# Patient Record
Sex: Male | Born: 1935
Health system: Southern US, Community
[De-identification: ages and names within clinical notes are randomized; demographics above are authoritative.]

## PROBLEM LIST (undated history)

## (undated) DIAGNOSIS — E079 Disorder of thyroid, unspecified: Secondary | ICD-10-CM

## (undated) DIAGNOSIS — N400 Enlarged prostate without lower urinary tract symptoms: Secondary | ICD-10-CM

## (undated) DIAGNOSIS — K5790 Diverticulosis of intestine, part unspecified, without perforation or abscess without bleeding: Secondary | ICD-10-CM

## (undated) DIAGNOSIS — I10 Essential (primary) hypertension: Secondary | ICD-10-CM

## (undated) DIAGNOSIS — K635 Polyp of colon: Secondary | ICD-10-CM

## (undated) DIAGNOSIS — C61 Malignant neoplasm of prostate: Secondary | ICD-10-CM

## (undated) DIAGNOSIS — I2510 Atherosclerotic heart disease of native coronary artery without angina pectoris: Secondary | ICD-10-CM

## (undated) DIAGNOSIS — E785 Hyperlipidemia, unspecified: Secondary | ICD-10-CM

## (undated) DIAGNOSIS — R972 Elevated prostate specific antigen [PSA]: Secondary | ICD-10-CM

## (undated) HISTORY — DX: Hyperlipidemia, unspecified: E78.5

## (undated) HISTORY — DX: Atherosclerotic heart disease of native coronary artery without angina pectoris: I25.10

## (undated) HISTORY — DX: Benign prostatic hyperplasia without lower urinary tract symptoms: N40.0

## (undated) HISTORY — DX: Diverticulosis of intestine, part unspecified, without perforation or abscess without bleeding: K57.90

## (undated) HISTORY — PX: PACEMAKER INSERTION: SHX728

## (undated) HISTORY — PX: HERNIA REPAIR: SHX51

## (undated) HISTORY — DX: Essential (primary) hypertension: I10

## (undated) HISTORY — DX: Polyp of colon: K63.5

## (undated) HISTORY — PX: PROSTATECTOMY: SHX69

## (undated) HISTORY — DX: Malignant neoplasm of prostate: C61

## (undated) HISTORY — DX: Elevated prostate specific antigen (PSA): R97.20

## (undated) HISTORY — DX: Disorder of thyroid, unspecified: E07.9

---

## 2012-11-12 ENCOUNTER — Other Ambulatory Visit: Payer: Self-pay | Admitting: *Deleted

## 2012-11-12 ENCOUNTER — Encounter: Payer: Self-pay | Admitting: Family Medicine

## 2012-11-12 MED ORDER — CLOBETASOL PROPIONATE 0.05 % EX CREA: TOPICAL_CREAM | Freq: Two times a day (BID) | CUTANEOUS | Status: DC

## 2012-11-12 NOTE — Telephone Encounter (Signed)
Need pharmacy if wants e-RX

## 2012-11-12 NOTE — Telephone Encounter (Signed)
Last seen 07/13/12, not on med list

## 2012-11-12 NOTE — Telephone Encounter (Signed)
[facility] said [facility] called this man and this was not his rx

## 2012-12-27 NOTE — Telephone Encounter (Signed)
ERROR

## 2013-01-03 ENCOUNTER — Encounter: Payer: Self-pay | Admitting: Family Medicine

## 2013-01-03 ENCOUNTER — Other Ambulatory Visit: Payer: Self-pay | Admitting: Family Medicine

## 2013-01-03 ENCOUNTER — Ambulatory Visit (INDEPENDENT_AMBULATORY_CARE_PROVIDER_SITE_OTHER): Payer: Medicare Other | Admitting: Family Medicine

## 2013-01-03 VITALS — BP 168/110 | HR 86 | Temp 98.0°F | Ht 71.5 in | Wt 174.2 lb

## 2013-01-03 DIAGNOSIS — I10 Essential (primary) hypertension: Secondary | ICD-10-CM

## 2013-01-03 DIAGNOSIS — M109 Gout, unspecified: Secondary | ICD-10-CM

## 2013-01-03 DIAGNOSIS — I25810 Atherosclerosis of coronary artery bypass graft(s) without angina pectoris: Secondary | ICD-10-CM

## 2013-01-03 DIAGNOSIS — Z8546 Personal history of malignant neoplasm of prostate: Secondary | ICD-10-CM

## 2013-01-03 DIAGNOSIS — E785 Hyperlipidemia, unspecified: Secondary | ICD-10-CM

## 2013-01-03 DIAGNOSIS — N189 Chronic kidney disease, unspecified: Secondary | ICD-10-CM

## 2013-01-03 LAB — COMPLETE METABOLIC PANEL WITH GFR
Albumin: 3.8 g/dL (ref 3.5–5.2)
Alkaline Phosphatase: 60 U/L (ref 39–117)
BUN: 24 mg/dL — ABNORMAL HIGH (ref 6–23)
Creat: 2.02 mg/dL — ABNORMAL HIGH (ref 0.50–1.35)
GFR, Est Non African American: 30 mL/min — ABNORMAL LOW
Glucose, Bld: 93 mg/dL (ref 70–99)
Potassium: 5 mEq/L (ref 3.5–5.3)

## 2013-01-03 NOTE — Patient Instructions (Addendum)
Fall precautions discussed. Continue therapeutic lifestyle changes and continue current medications.

## 2013-01-03 NOTE — Progress Notes (Signed)
  Subjective:    Patient ID: Ivan West, male    DOB: 1931-07-26, 79 y.o.   MRN: [id number]  HPI Patient returns today for followup of chronic problems.. He sees Dr.Gibbs, the gastroenterologist. He also sees Dr. Wyvonnia Lora the nephrologist t. He will see her in June 18. He is followed by Dr. Esperanza Sheets for his   Gout. He also sees Dr. Zenia Resides for his cardiology problems.   Review of Systems  Constitutional: Negative.   HENT: Negative.   Eyes: Negative.   Respiratory: Negative.   Cardiovascular: Negative.   Gastrointestinal: Negative.   Endocrine: Negative.   Genitourinary: Negative.   Musculoskeletal: Negative.   Skin: Negative.   Allergic/Immunologic: Negative.   Neurological: Negative.   Hematological: Negative.   Psychiatric/Behavioral: Negative.        Objective:   Physical Exam BP 168/110  Pulse 86  Temp(Src) 98 F (36.7 C) (Oral)  Ht 5' 11.5" (1.816 m)  Wt 174 lb 3.2 oz (79.017 kg)  BMI 23.96 kg/m2 Blood pressure repeat 152/90 The patient appeared well nourished and normally developed, alert and oriented to time and place. The patient appears much younger than his stated age. Speech, behavior and judgement appear normal. Vital signs as documented.  Head exam is unremarkable. No scleral icterus or pallor noted. Ears and throat were normal. Neck is without jugular venous distension, thyromegally, or carotid bruits. Carotid upstrokes are brisk bilaterally. No cervical adenopathy. Lungs are clear anteriorly and posteriorly to auscultation. Normal respiratory effort. Cardiac exam reveals regular rate and rhythm at 60 per minute. First and second heart sounds normal.  No murmurs, rubs or gallops.  Abdominal exam reveals normal bowl sounds, no masses, no organomegaly and no aortic enlargement. No inguinal adenopathy. No abdominal tenderness Extremities are nonedematous and both femoral and pedal pulses are normal. Skin without pallor or jaundice.  Warm and dry, without  rash. Neurologic exam reveals normal deep tendon reflexes and normal sensation.          Assessment & Plan:  1. HTN (hypertension) - COMPLETE METABOLIC PANEL WITH GFR - NMR Lipoprofile with Lipids  2. CAD (coronary artery disease) of artery bypass graft - COMPLETE METABOLIC PANEL WITH GFR - NMR Lipoprofile with Lipids  3. Other and unspecified hyperlipidemia - COMPLETE METABOLIC PANEL WITH GFR - NMR Lipoprofile with Lipids  4. CKD   Patient Instructions  Fall precautions discussed. Continue therapeutic lifestyle changes and continue current medications.   Drop by next week and get blood pressure checked by nurse and bring home readings at that time

## 2013-01-08 LAB — NMR LIPOPROFILE WITH LIPIDS
HDL Size: 9 nm — ABNORMAL LOW (ref >=9.2)
LDL (calc): 83 mg/dL (ref <100)
LDL Particle Number: 780 nmol/L (ref <1000)
Large VLDL-P: <0.8 nmol/L (ref <=2.7)
Small LDL Particle Number: 217 nmol/L (ref <=527)
Triglycerides: 124 mg/dL (ref <150)
VLDL Size: 40.8 nm (ref <=46.6)

## 2013-01-09 ENCOUNTER — Telehealth: Payer: Self-pay | Admitting: Pharmacist

## 2013-01-09 NOTE — Telephone Encounter (Signed)
appt made and patient notified.

## 2013-01-09 NOTE — Telephone Encounter (Signed)
Message copied by Cherre Robins on Wed Jan 09, 2013  5:36 PM ------      Message from: Chipper Herb      Created: Tue Jan 08, 2013  5:18 PM       Electrolytes are good. Creatinine is elevated at 2.02. Previous creatinine was 1.66.. this should be repeated non-fasting to confirm the additional elevation.+++   Avoid NSAIDS. Make an appointment with Lareen Mullings or. Sharyn Lull. The Lotensin which is an ACE inhibitor may have to be discontinued or reduced      Liver function tests were within normal limits      Calcium slightly elevated at 10.6      Advanced lipid testing had a total LDL particle number of 780. This is good. Triglycerides were slightly increased. Small LDL particle number was low.      Continue aggressive therapeutic lifestyle changes and blood sugar control.            It is very important he gets the appointment with Floyd Wade or Sharyn Lull as soon as possible and gets his BM P. repeated ------

## 2013-01-11 ENCOUNTER — Ambulatory Visit (INDEPENDENT_AMBULATORY_CARE_PROVIDER_SITE_OTHER): Payer: Medicare Other | Admitting: *Deleted

## 2013-01-11 DIAGNOSIS — R7989 Other specified abnormal findings of blood chemistry: Secondary | ICD-10-CM

## 2013-01-11 NOTE — Progress Notes (Signed)
  Subjective:    Patient ID: Ivan West, male    DOB: 1931-07-07, 79 y.o.   MRN: [id number]  HPI    Review of Systems     Objective:   Physical Exam        Assessment & Plan:

## 2013-01-12 LAB — BASIC METABOLIC PANEL WITH GFR
BUN: 25 mg/dL — ABNORMAL HIGH (ref 6–23)
Creat: 2.11 mg/dL — ABNORMAL HIGH (ref 0.50–1.35)
GFR, Est African American: 33 mL/min — ABNORMAL LOW
GFR, Est Non African American: 29 mL/min — ABNORMAL LOW
Potassium: 4.9 mEq/L (ref 3.5–5.3)

## 2013-01-15 ENCOUNTER — Telehealth: Payer: Self-pay | Admitting: *Deleted

## 2013-01-15 NOTE — Telephone Encounter (Signed)
Message copied by Marin Olp on Tue Jan 15, 2013  6:03 PM ------      Message from: Chipper Herb      Created: Tue Jan 08, 2013  5:18 PM       Electrolytes are good. Creatinine is elevated at 2.02. Previous creatinine was 1.66.. this should be repeated non-fasting to confirm the additional elevation.+++   Avoid NSAIDS. Make an appointment with Tammy or. Sharyn Lull. The Lotensin which is an ACE inhibitor may have to be discontinued or reduced      Liver function tests were within normal limits      Calcium slightly elevated at 10.6      Advanced lipid testing had a total LDL particle number of 780. This is good. Triglycerides were slightly increased. Small LDL particle number was low.      Continue aggressive therapeutic lifestyle changes and blood sugar control.            It is very important he gets the appointment with Tammy or Sharyn Lull as soon as possible and gets his BM P. repeated ------

## 2013-01-15 NOTE — Telephone Encounter (Signed)
Pt notified of lab results Already has appt with [facility] on 6/17

## 2013-01-29 ENCOUNTER — Ambulatory Visit (INDEPENDENT_AMBULATORY_CARE_PROVIDER_SITE_OTHER): Payer: Medicare Other | Admitting: Pharmacist

## 2013-01-29 VITALS — BP 160/90 | HR 74 | Ht 71.5 in | Wt 175.0 lb

## 2013-01-29 DIAGNOSIS — R7989 Other specified abnormal findings of blood chemistry: Secondary | ICD-10-CM | POA: Insufficient documentation

## 2013-01-29 DIAGNOSIS — I10 Essential (primary) hypertension: Secondary | ICD-10-CM

## 2013-01-29 DIAGNOSIS — R799 Abnormal finding of blood chemistry, unspecified: Secondary | ICD-10-CM

## 2013-01-29 DIAGNOSIS — M109 Gout, unspecified: Secondary | ICD-10-CM

## 2013-01-29 DIAGNOSIS — I129 Hypertensive chronic kidney disease with stage 1 through stage 4 chronic kidney disease, or unspecified chronic kidney disease: Secondary | ICD-10-CM | POA: Insufficient documentation

## 2013-01-29 MED ORDER — AMLODIPINE BESYLATE 5 MG PO TABS: 5.0000 mg | ORAL_TABLET | Freq: Every day | ORAL | Status: DC

## 2013-01-29 NOTE — Progress Notes (Signed)
Chief Complaint  Patient presents with  . Medication Problem    elevated serum creatinine  . Hypertension     There were no vitals filed for this visit.   HPI: patient was referred by Dr Laurance Flatten to review medications in light of increase in serum creatinine.  Mr.. Ivan West states that he has an appoitment with a nephrologist tomorrow.  Patient also currently has uncontrolled hypertension.  Last Scr = 2.11 Estimated CrCl = 26.7 mL/min  Current Outpatient Prescriptions on File Prior to Visit  Medication Sig Dispense Refill  . aspirin 81 MG tablet Take 81 mg by mouth daily.      . benazepril (LOTENSIN) 40 MG tablet Take 40 mg by mouth daily.      . carvedilol (COREG) 12.5 MG tablet Take 12.5 mg by mouth 2 (two) times daily with a meal.      . cholecalciferol (VITAMIN D) 1000 UNITS tablet Take 1,000 Units by mouth daily.      . febuxostat (ULORIC) 40 MG tablet Take 80 mg by mouth daily.      . fish oil-omega-3 fatty acids 1000 MG capsule Take 1 g by mouth daily.      . furosemide (LASIX) 20 MG tablet TAKE 1 TABLET AS DIRECTED AS NEEDED.  30 tablet  5  . levothyroxine (SYNTHROID, LEVOTHROID) 75 MCG tablet Take 75 mcg by mouth daily before breakfast.      . mirabegron ER (MYRBETRIQ) 50 MG TB24 Take 50 mg by mouth daily.      . potassium & sodium citrate-citric acid-sucrose (POLYCITRA) 550-500-334 MG/5ML SYRP Take 5 mLs by mouth 2 (two) times daily.       No current facility-administered medications on file prior to visit.    Assessment:      Increased serum creatinine      Hypertension      Gout   Plan: 1.  Hold benazepril.  Start amlodipine 5mg  1 tablet daily 2.  Also gave patient list of medications to discuss with nephrologist tomorrow to consider holding, decreasing dose or discontinuing.  This list included:  Hold vitamin D, hold uloric (has not been studied in subjects with CrCl less than 54mL.min), decrease mirabegron ER to 25mg  daily (dose adjustment recommended by manufacturer  for CrCl 15 to 29) 3.  RTC in 2-3 weeks to recheck BP and BMP

## 2013-01-30 ENCOUNTER — Other Ambulatory Visit: Payer: Self-pay | Admitting: Family Medicine

## 2013-01-30 ENCOUNTER — Telehealth: Payer: Self-pay | Admitting: Pharmacist

## 2013-01-30 NOTE — Telephone Encounter (Signed)
Ok noted  

## 2013-01-30 NOTE — Progress Notes (Signed)
Patient called after appt with nephrologist today.  Nephrologist did agree with discontinuation of benazepril and start amlodipine.  Continue all other medications for now.

## 2013-02-01 NOTE — Telephone Encounter (Signed)
See last Thyroid labs, chart sent back

## 2013-03-07 ENCOUNTER — Other Ambulatory Visit: Payer: Self-pay | Admitting: Family Medicine

## 2013-05-20 ENCOUNTER — Ambulatory Visit (INDEPENDENT_AMBULATORY_CARE_PROVIDER_SITE_OTHER): Payer: Medicare Other

## 2013-05-20 DIAGNOSIS — Z23 Encounter for immunization: Secondary | ICD-10-CM

## 2013-06-05 ENCOUNTER — Other Ambulatory Visit: Payer: Self-pay | Admitting: Family Medicine

## 2013-07-08 ENCOUNTER — Encounter (INDEPENDENT_AMBULATORY_CARE_PROVIDER_SITE_OTHER): Payer: Self-pay

## 2013-07-08 ENCOUNTER — Ambulatory Visit (INDEPENDENT_AMBULATORY_CARE_PROVIDER_SITE_OTHER): Payer: Medicare Other | Admitting: Family Medicine

## 2013-07-08 ENCOUNTER — Ambulatory Visit (INDEPENDENT_AMBULATORY_CARE_PROVIDER_SITE_OTHER): Payer: Medicare Other

## 2013-07-08 ENCOUNTER — Encounter: Payer: Self-pay | Admitting: Family Medicine

## 2013-07-08 VITALS — BP 136/85 | HR 69 | Temp 97.6°F | Ht 71.5 in | Wt 172.0 lb

## 2013-07-08 DIAGNOSIS — E559 Vitamin D deficiency, unspecified: Secondary | ICD-10-CM

## 2013-07-08 DIAGNOSIS — I2510 Atherosclerotic heart disease of native coronary artery without angina pectoris: Secondary | ICD-10-CM | POA: Insufficient documentation

## 2013-07-08 DIAGNOSIS — R972 Elevated prostate specific antigen [PSA]: Secondary | ICD-10-CM | POA: Insufficient documentation

## 2013-07-08 DIAGNOSIS — E039 Hypothyroidism, unspecified: Secondary | ICD-10-CM | POA: Insufficient documentation

## 2013-07-08 DIAGNOSIS — R42 Dizziness and giddiness: Secondary | ICD-10-CM

## 2013-07-08 DIAGNOSIS — E785 Hyperlipidemia, unspecified: Secondary | ICD-10-CM | POA: Insufficient documentation

## 2013-07-08 DIAGNOSIS — C61 Malignant neoplasm of prostate: Secondary | ICD-10-CM | POA: Insufficient documentation

## 2013-07-08 DIAGNOSIS — I10 Essential (primary) hypertension: Secondary | ICD-10-CM

## 2013-07-08 DIAGNOSIS — Z23 Encounter for immunization: Secondary | ICD-10-CM

## 2013-07-08 LAB — POCT CBC
HCT, POC: 43.8 % (ref 43.5–53.7)
Hemoglobin: 13.6 g/dL — AB (ref 14.1–18.1)
Lymph, poc: 1.4 (ref 0.6–3.4)
MCH, POC: 25.2 pg — AB (ref 27–31.2)
MCV: 81.2 fL (ref 80–97)
MPV: 7.7 fL (ref 0–99.8)
POC LYMPH PERCENT: 37.7 %L (ref 10–50)
RBC: 5.4 M/uL (ref 4.69–6.13)
RDW, POC: 14.9 %

## 2013-07-08 NOTE — Patient Instructions (Addendum)
Continue current medications. Continue good therapeutic lifestyle changes which include good diet and exercise. Fall precautions discussed with patient. Schedule your flu vaccine if you haven't had it yet If you are over 77 years old - you may need Prevnar 33 or the adult Pneumonia vaccine. Monitor blood pressures closely at home and especially if he is feeling dizzy, check a blood pressure then Bring these readings for review in 2-3 weeks Get a good pair of support hose and put these beside your bed and put these on every morning upon arising  There is a cool mist humidifier and drink plenty of fluids and keep the house is cool as possible. You will receive a Prevnar shot today. Return here FOBT when it is completed. We will call you with the results of the lab work and a chest x-ray interpretation once they are available.

## 2013-07-08 NOTE — Progress Notes (Signed)
Subjective:    Patient ID: Ivan West, male    DOB: [birth year], 79 y.o.   MRN: [id number]  HPI Pt here for follow up and management of chronic medical problems. This patient is always present and is followed by several specialists for his multiple problems. He sees a rheumatologist, a nephrologist, a urologist, a cardiologist, and a gastroenterologist about every 5 years. He has kept up to date with all of his visits.      Patient Active Problem List   Diagnosis Date Noted  . Hypertension 01/29/2013  . Elevated serum creatinine 01/29/2013  . Gout 01/29/2013   Outpatient Encounter Prescriptions as of 07/08/2013  Medication Sig  . amLODipine (NORVASC) 10 MG tablet TAKE (1) TABLET DAILY  . aspirin 81 MG tablet Take 81 mg by mouth daily.  . carvedilol (COREG) 12.5 MG tablet Take 12.5 mg by mouth 2 (two) times daily with a meal.  . cholecalciferol (VITAMIN D) 1000 UNITS tablet Take 1,000 Units by mouth daily.  . febuxostat (ULORIC) 40 MG tablet Take 80 mg by mouth daily.  . fish oil-omega-3 fatty acids 1000 MG capsule Take 1 g by mouth daily.  . furosemide (LASIX) 20 MG tablet TAKE 1 TABLET AS DIRECTED AS NEEDED.  Marland Kitchen levothyroxine (SYNTHROID, LEVOTHROID) 75 MCG tablet TAKE (1) TABLET DAILY  . [DISCONTINUED] amLODipine (NORVASC) 5 MG tablet Take 1 tablet (5 mg total) by mouth daily.  . [DISCONTINUED] benazepril (LOTENSIN) 40 MG tablet Take 40 mg by mouth daily.  . [DISCONTINUED] levothyroxine (SYNTHROID, LEVOTHROID) 75 MCG tablet TAKE (1) TABLET DAILY  . [DISCONTINUED] mirabegron ER (MYRBETRIQ) 50 MG TB24 Take 50 mg by mouth daily.  . [DISCONTINUED] potassium & sodium citrate-citric acid-sucrose (POLYCITRA) 550-500-334 MG/5ML SYRP Take 5 mLs by mouth 2 (two) times daily.    Review of Systems  Constitutional: Negative.   HENT: Negative.   Eyes: Negative.   Respiratory: Negative.   Cardiovascular: Negative.   Gastrointestinal: Negative.   Endocrine: Negative.   Genitourinary:  Negative.   Musculoskeletal: Negative.   Skin: Negative.   Allergic/Immunologic: Negative.   Neurological: Positive for dizziness (worse with quick movements).  Hematological: Negative.   Psychiatric/Behavioral: Negative.        Objective:   Physical Exam  Nursing note and vitals reviewed. Constitutional: He is oriented to person, place, and time. He appears well-developed and well-nourished.  Always smiling and very pleasant  HENT:  Head: Normocephalic and atraumatic.  Right Ear: External ear normal.  Left Ear: External ear normal.  Nose: Nose normal.  Mouth/Throat: Oropharynx is clear and moist. No oropharyngeal exudate.  Eyes: Conjunctivae and EOM are normal. Pupils are equal, round, and reactive to light. Right eye exhibits no discharge. Left eye exhibits no discharge. No scleral icterus.  Neck: Normal range of motion. Neck supple. No tracheal deviation present. No thyromegaly present.  No carotid bruit  Cardiovascular: Normal rate, regular rhythm, normal heart sounds and intact distal pulses.  Exam reveals no gallop and no friction rub.   No murmur heard. 72 per minute  Pulmonary/Chest: Effort normal and breath sounds normal. No respiratory distress. He has no wheezes. He has no rales. He exhibits no tenderness.  Abdominal: Soft. Bowel sounds are normal. He exhibits no mass. There is no tenderness. There is no rebound and no guarding.  Patient has a small umbilical hernia  Musculoskeletal: Normal range of motion. He exhibits no edema and no tenderness.  Lymphadenopathy:    He has no cervical adenopathy.  Neurological: He is  alert and oriented to person, place, and time. He has normal reflexes. No cranial nerve deficit.  Skin: Skin is warm and dry. No rash noted.  Psychiatric: He has a normal mood and affect. His behavior is normal. Judgment and thought content normal.   BP 136/85  Pulse 69  Temp(Src) 97.6 F (36.4 C) (Oral)  Ht 5' 11.5" (1.816 m)  Wt 172 lb (78.019 kg)   BMI 23.66 kg/m2        Assessment & Plan:   1. Hypertension   2. Vitamin D deficiency   3. Need for prophylactic vaccination against Streptococcus pneumoniae (pneumococcus)   4. Hypothyroidism   5. Hyperlipidemia   6. Elevated PSA   7. Prostate cancer   8. CAD (coronary artery disease)   9. Dizziness    Orders Placed This Encounter  Procedures  . DG Chest 2 View    Standing Status: Future     Number of Occurrences:      Standing Expiration Date: 09/07/2014    Order Specific Question:  Reason for Exam (SYMPTOM  OR DIAGNOSIS REQUIRED)    Answer:  htn    Order Specific Question:  Preferred imaging location?    Answer:  Internal  . Pneumococcal conjugate vaccine 13-valent  . BMP8+EGFR  . Hepatic function panel  . NMR, lipoprofile  . Vit D  25 hydroxy (rtn osteoporosis monitoring)  . POCT CBC   No orders of the defined types were placed in this encounter.   Patient Instructions  Continue current medications. Continue good therapeutic lifestyle changes which include good diet and exercise. Fall precautions discussed with patient. Schedule your flu vaccine if you haven't had it yet If you are over 9 years old - you may need Prevnar 30 or the adult Pneumonia vaccine. Monitor blood pressures closely at home and especially if he is feeling dizzy, check a blood pressure then Bring these readings for review in 2-3 weeks Get a good pair of support hose and put these beside your bed and put these on every morning upon arising  There is a cool mist humidifier and drink plenty of fluids and keep the house is cool as possible. You will receive a Prevnar shot today. Return here FOBT when it is completed. We will call you with the results of the lab work and a chest x-ray interpretation once they are available.   Arrie Senate MD

## 2013-07-09 LAB — NMR, LIPOPROFILE
Cholesterol: 147 mg/dL (ref <200)
LDL Particle Number: 986 nmol/L (ref <1000)
LDL Size: 21.1 nm (ref >20.5)
LDLC SERPL CALC-MCNC: 81 mg/dL (ref <100)
LP-IR Score: 41 (ref <=45)
Small LDL Particle Number: 511 nmol/L (ref <=527)
Triglycerides by NMR: 88 mg/dL (ref <150)

## 2013-07-09 LAB — HEPATIC FUNCTION PANEL
AST: 18 IU/L (ref 0–40)
Alkaline Phosphatase: 79 IU/L (ref 39–117)
Bilirubin, Direct: 0.09 mg/dL (ref 0.00–0.40)
Total Bilirubin: 0.4 mg/dL (ref 0.0–1.2)
Total Protein: 6.7 g/dL (ref 6.0–8.5)

## 2013-07-09 LAB — BMP8+EGFR
Calcium: 10.6 mg/dL — ABNORMAL HIGH (ref 8.6–10.2)
Creatinine, Ser: 1.82 mg/dL — ABNORMAL HIGH (ref 0.76–1.27)
GFR calc Af Amer: 40 mL/min/{1.73_m2} — ABNORMAL LOW (ref >59)
GFR calc non Af Amer: 34 mL/min/{1.73_m2} — ABNORMAL LOW (ref >59)
Glucose: 83 mg/dL (ref 65–99)
Potassium: 4.4 mmol/L (ref 3.5–5.2)
Sodium: 142 mmol/L (ref 134–144)

## 2013-07-16 ENCOUNTER — Other Ambulatory Visit (INDEPENDENT_AMBULATORY_CARE_PROVIDER_SITE_OTHER): Payer: Medicare Other

## 2013-07-16 DIAGNOSIS — Z1212 Encounter for screening for malignant neoplasm of rectum: Secondary | ICD-10-CM

## 2013-07-16 NOTE — Progress Notes (Signed)
Pt came in for FOBT

## 2013-07-17 ENCOUNTER — Telehealth: Payer: Self-pay | Admitting: *Deleted

## 2013-07-17 LAB — FECAL OCCULT BLOOD, IMMUNOCHEMICAL: Fecal Occult Bld: NEGATIVE

## 2013-07-17 NOTE — Telephone Encounter (Signed)
Lm to call back

## 2013-07-17 NOTE — Telephone Encounter (Signed)
Message copied by Priscille Heidelberg on Wed Jul 17, 2013 11:39 AM ------      Message from: Chipper Herb      Created: Tue Jul 09, 2013  9:37 PM       The blood sugar was normal. The creatinine was elevated at 1.8 to. Previously it was 2.11. Electrolytes were good. The calcium was slightly elevated at 10.6. 6 months ago it was also 10.6.      Liver function tests are within normal limit      On advanced lipid testing, the total LDL particle number is at goal and all cholesterol numbers are excellent      The vitamin D level is at the low of the normal range. ------

## 2013-07-18 NOTE — Telephone Encounter (Signed)
PT RETURNED NURSE CALL-  PT NOTIFIED OF NORMAL LAB RESULTS PER MESSAGE NURSE PUT IN NOTES.  R.SHAFFER 07/18/13

## 2013-08-05 ENCOUNTER — Telehealth: Payer: Self-pay | Admitting: *Deleted

## 2013-08-05 NOTE — Telephone Encounter (Signed)
Pt notified and verbalized understanding. April can you make sure all of pts lab results get sent to his cardiologist, nephrologist, and urologist. Thanks.

## 2013-08-05 NOTE — Telephone Encounter (Signed)
Message copied by Priscille Heidelberg on Mon Aug 05, 2013 12:58 PM ------      Message from: Chipper Herb      Created: Mon Jul 08, 2013  1:05 PM       The CBC has a white blood cell count that is slightly decreased. Hemoglobin is slightly decreased at 13.6, the platelet count is adequate.----Gleason a copy of this report and additional labs are coming to come in to his cardiologist, nephrologist, and urologist. ------

## 2013-08-06 NOTE — Telephone Encounter (Signed)
Message copied by Priscille Heidelberg on Tue Aug 06, 2013  2:01 PM ------      Message from: Chipper Herb      Created: Tue Jul 09, 2013  9:37 PM       The blood sugar was normal. The creatinine was elevated at 1.8 to. Previously it was 2.11. Electrolytes were good. The calcium was slightly elevated at 10.6. 6 months ago it was also 10.6.      Liver function tests are within normal limit      On advanced lipid testing, the total LDL particle number is at goal and all cholesterol numbers are excellent      The vitamin D level is at the low of the normal range. ------

## 2013-08-30 ENCOUNTER — Other Ambulatory Visit: Payer: Self-pay | Admitting: Family Medicine

## 2013-09-02 NOTE — Telephone Encounter (Signed)
Please have patient come in and get thyroid profile

## 2013-09-02 NOTE — Telephone Encounter (Signed)
No thyroid labwork done since 10-13. Please advise

## 2013-12-02 ENCOUNTER — Other Ambulatory Visit: Payer: Self-pay | Admitting: Family Medicine

## 2013-12-02 DIAGNOSIS — E039 Hypothyroidism, unspecified: Secondary | ICD-10-CM

## 2013-12-03 NOTE — Telephone Encounter (Signed)
Last seen 07/08/13  [facility] NO thryoid levels since EPIC

## 2013-12-04 NOTE — Telephone Encounter (Signed)
Left message that patient is due for a thyroid panel. This was not ordered at his visit in 06/2013. Future order placed for patient to come in for this bloodwork.

## 2013-12-05 ENCOUNTER — Other Ambulatory Visit (INDEPENDENT_AMBULATORY_CARE_PROVIDER_SITE_OTHER): Payer: Medicare Other

## 2013-12-05 DIAGNOSIS — E039 Hypothyroidism, unspecified: Secondary | ICD-10-CM

## 2013-12-05 NOTE — Progress Notes (Signed)
Patient came in for labs only.

## 2013-12-06 LAB — THYROID PANEL WITH TSH
FREE THYROXINE INDEX: 2.7 (ref 1.2–4.9)
T3 Uptake Ratio: 35 % (ref 24–39)
T4, Total: 7.6 ug/dL (ref 4.5–12.0)
TSH: 0.788 u[IU]/mL (ref 0.450–4.500)

## 2013-12-11 ENCOUNTER — Telehealth: Payer: Self-pay | Admitting: Family Medicine

## 2013-12-11 NOTE — Telephone Encounter (Signed)
Pt aware of lab results.  rs °

## 2013-12-11 NOTE — Telephone Encounter (Signed)
Message copied by Waverly Ferrari on Wed Dec 11, 2013 10:22 AM ------      Message from: Chipper Herb      Created: Fri Dec 06, 2013  8:39 AM       All thyroid function tests are within normal limits, continue current treatment ------

## 2014-01-07 ENCOUNTER — Ambulatory Visit (INDEPENDENT_AMBULATORY_CARE_PROVIDER_SITE_OTHER): Payer: Medicare Other | Admitting: Family Medicine

## 2014-01-07 ENCOUNTER — Encounter: Payer: Self-pay | Admitting: Family Medicine

## 2014-01-07 VITALS — BP 125/77 | HR 65 | Temp 96.9°F | Ht 71.5 in | Wt 172.0 lb

## 2014-01-07 DIAGNOSIS — R7989 Other specified abnormal findings of blood chemistry: Secondary | ICD-10-CM

## 2014-01-07 DIAGNOSIS — C61 Malignant neoplasm of prostate: Secondary | ICD-10-CM

## 2014-01-07 DIAGNOSIS — E039 Hypothyroidism, unspecified: Secondary | ICD-10-CM

## 2014-01-07 DIAGNOSIS — R5381 Other malaise: Secondary | ICD-10-CM

## 2014-01-07 DIAGNOSIS — R972 Elevated prostate specific antigen [PSA]: Secondary | ICD-10-CM

## 2014-01-07 DIAGNOSIS — I2510 Atherosclerotic heart disease of native coronary artery without angina pectoris: Secondary | ICD-10-CM

## 2014-01-07 DIAGNOSIS — R5383 Other fatigue: Secondary | ICD-10-CM

## 2014-01-07 DIAGNOSIS — E785 Hyperlipidemia, unspecified: Secondary | ICD-10-CM

## 2014-01-07 DIAGNOSIS — E559 Vitamin D deficiency, unspecified: Secondary | ICD-10-CM

## 2014-01-07 DIAGNOSIS — I10 Essential (primary) hypertension: Secondary | ICD-10-CM

## 2014-01-07 LAB — POCT GLYCOSYLATED HEMOGLOBIN (HGB A1C): Hemoglobin A1C: 5.8

## 2014-01-07 LAB — POCT CBC
Granulocyte percent: 62.1 %G (ref 37–80)
HCT, POC: 47.3 % (ref 43.5–53.7)
Hemoglobin: 14.7 g/dL (ref 14.1–18.1)
Lymph, poc: 1.1 (ref 0.6–3.4)
MCH, POC: 26.3 pg — AB (ref 27–31.2)
MCHC: 31.2 g/dL — AB (ref 31.8–35.4)
MCV: 84.4 fL (ref 80–97)
MPV: 8.5 fL (ref 0–99.8)
POC GRANULOCYTE: 2.2 (ref 2–6.9)
POC LYMPH %: 31.8 % (ref 10–50)
Platelet Count, POC: 181 10*3/uL (ref 142–424)
RBC: 5.6 M/uL (ref 4.69–6.13)
RDW, POC: 14.4 %
WBC: 3.6 10*3/uL — AB (ref 4.6–10.2)

## 2014-01-07 NOTE — Patient Instructions (Addendum)
Medicare Annual Wellness Visit  [facility] and the medical providers at [facility] strive to bring you the best medical care.  In doing so we not only want to address your current medical conditions and concerns but also to detect new conditions early and prevent illness, disease and health-related problems.    Medicare offers a yearly Wellness Visit which allows our clinical staff to assess your need for preventative services including immunizations, lifestyle education, counseling to decrease risk of preventable diseases and screening for fall risk and other medical concerns.    This visit is provided free of charge (no copay) for all Medicare recipients. The clinical pharmacists at [facility] have begun to conduct these Wellness Visits which will also include a thorough review of all your medications.    As you primary medical provider recommend that you make an appointment for your Annual Wellness Visit if you have not done so already this year.  You may set up this appointment before you leave today or you may call back [phone]) and schedule an appointment.  Please make sure when you call that you mention that you are scheduling your Annual Wellness Visit with the clinical pharmacist so that the appointment may be made for the proper length of time.      Continue current medications. Continue good therapeutic lifestyle changes which include good diet and exercise. Fall precautions discussed with patient. If an FOBT was given today- please return it to our front desk. If you are over 64 years old - you may need Prevnar 32 or the adult Pneumonia vaccine.  Continue to monitor blood pressures at home Continue followup with specialists Drink plenty of fluids

## 2014-01-07 NOTE — Addendum Note (Signed)
Addended by: Wyline Mood on: 01/07/2014 08:56 AM   Modules accepted: Orders

## 2014-01-07 NOTE — Progress Notes (Signed)
Subjective:    Patient ID: Ivan VIRGIN, male    DOB: 12/07/1930, 80 y.o.   MRN: [id number]  HPI Pt here for follow up and management of chronic medical problems. The patient is doing well. He is followed for his prostate by Dr. Ree Edman. He sees the rheumatologist Dr. Esperanza Sheets once a year. He is followed by the cardiologist about every 6 months and this is Dr. Zenia Resides. Dr. Hettie Holstein is his nephrologist and she sees him about every 6 months. The patient has a history of coronary artery disease, prostate cancer, renal insufficiency, hyperlipidemia and hypertension. He is up to date on his health maintenance issues. Please see the outside blood pressures that he brought in and all of these are good.        Patient Active Problem List   Diagnosis Date Noted  . Hypothyroidism 07/08/2013  . Hyperlipidemia 07/08/2013  . Elevated PSA 07/08/2013  . Prostate cancer 07/08/2013  . CAD (coronary artery disease) 07/08/2013  . Hypertension 01/29/2013  . Elevated serum creatinine 01/29/2013  . Gout 01/29/2013   Outpatient Encounter Prescriptions as of 01/07/2014  Medication Sig  . amLODipine (NORVASC) 10 MG tablet TAKE (1) TABLET DAILY  . aspirin 81 MG tablet Take 81 mg by mouth daily.  . carvedilol (COREG) 12.5 MG tablet Take 12.5 mg by mouth 2 (two) times daily with a meal.  . cholecalciferol (VITAMIN D) 1000 UNITS tablet Take 1,000 Units by mouth daily.  . febuxostat (ULORIC) 40 MG tablet Take 80 mg by mouth daily.  . fish oil-omega-3 fatty acids 1000 MG capsule Take 1 g by mouth daily.  . furosemide (LASIX) 20 MG tablet TAKE 1 TABLET AS DIRECTED AS NEEDED.  Marland Kitchen levothyroxine (SYNTHROID, LEVOTHROID) 75 MCG tablet TAKE 1 TABLET BY MOUTH DAILY    Review of Systems  Constitutional: Negative.   HENT: Negative.   Eyes: Negative.   Respiratory: Negative.   Cardiovascular: Negative.   Gastrointestinal: Negative.   Endocrine: Negative.   Genitourinary: Negative.   Musculoskeletal: Negative.     Skin: Negative.   Allergic/Immunologic: Negative.   Neurological: Negative.   Hematological: Negative.   Psychiatric/Behavioral: Negative.        Objective:   Physical Exam  Nursing note and vitals reviewed. Constitutional: He is oriented to person, place, and time. He appears well-developed and well-nourished. No distress.  Pleasant and cooperative and much younger appearing than his stated age  HENT:  Head: Normocephalic and atraumatic.  Right Ear: External ear normal.  Left Ear: External ear normal.  Nose: Nose normal.  Mouth/Throat: Oropharynx is clear and moist. No oropharyngeal exudate.  Eyes: Conjunctivae and EOM are normal. Pupils are equal, round, and reactive to light. Right eye exhibits no discharge. Left eye exhibits no discharge. No scleral icterus.  Neck: Normal range of motion. Neck supple. No thyromegaly present.  Cardiovascular: Normal rate, regular rhythm, normal heart sounds and intact distal pulses.  Exam reveals no gallop and no friction rub.   No murmur heard. At 72 per minute  Pulmonary/Chest: Effort normal and breath sounds normal. No respiratory distress. He has no wheezes. He has no rales. He exhibits no tenderness.  Abdominal: Soft. Bowel sounds are normal. He exhibits no mass. There is no tenderness. There is no rebound and no guarding.  Genitourinary:  This was followed by his urologist, Dr. Ree Edman  Musculoskeletal: Normal range of motion. He exhibits no edema and no tenderness.  Lymphadenopathy:    He has no cervical adenopathy.  Neurological: He  is alert and oriented to person, place, and time. He has normal reflexes. No cranial nerve deficit.  Skin: Skin is warm and dry. No rash noted. No erythema. No pallor.  Psychiatric: He has a normal mood and affect. His behavior is normal. Judgment and thought content normal.   BP 125/77  Pulse 65  Temp(Src) 96.9 F (36.1 C) (Oral)  Ht 5' 11.5" (1.816 m)  Wt 172 lb (78.019 kg)  BMI 23.66  kg/m2        Assessment & Plan:  1. CAD (coronary artery disease) - POCT CBC  2. Hyperlipidemia - POCT CBC - NMR, lipoprofile  3. Hypertension - POCT CBC - BMP8+EGFR - Hepatic function panel  4. Hypothyroidism - POCT CBC  5. Prostate cancer - POCT CBC  6. Elevated PSA -History of prostate cancer and prostatectomy - POCT CBC  7. Vitamin D deficiency - Vit D  25 hydroxy (rtn osteoporosis monitoring)  Patient Instructions                       Medicare Annual Wellness Visit  [facility] and the medical providers at [facility] strive to bring you the best medical care.  In doing so we not only want to address your current medical conditions and concerns but also to detect new conditions early and prevent illness, disease and health-related problems.    Medicare offers a yearly Wellness Visit which allows our clinical staff to assess your need for preventative services including immunizations, lifestyle education, counseling to decrease risk of preventable diseases and screening for fall risk and other medical concerns.    This visit is provided free of charge (no copay) for all Medicare recipients. The clinical pharmacists at [facility] have begun to conduct these Wellness Visits which will also include a thorough review of all your medications.    As you primary medical provider recommend that you make an appointment for your Annual Wellness Visit if you have not done so already this year.  You may set up this appointment before you leave today or you may call back [phone]) and schedule an appointment.  Please make sure when you call that you mention that you are scheduling your Annual Wellness Visit with the clinical pharmacist so that the appointment may be made for the proper length of time.      Continue current medications. Continue good therapeutic lifestyle changes which include good diet and exercise. Fall  precautions discussed with patient. If an FOBT was given today- please return it to our front desk. If you are over 62 years old - you may need Prevnar 14 or the adult Pneumonia vaccine.  Continue to monitor blood pressures at home Continue followup with specialists Drink plenty of fluids   Arrie Senate MD

## 2014-01-08 LAB — BMP8+EGFR
BUN/Creatinine Ratio: 14 (ref 10–22)
BUN: 33 mg/dL — ABNORMAL HIGH (ref 8–27)
CALCIUM: 10.6 mg/dL — AB (ref 8.6–10.2)
CO2: 17 mmol/L — ABNORMAL LOW (ref 18–29)
Chloride: 105 mmol/L (ref 97–108)
Creatinine, Ser: 2.36 mg/dL — ABNORMAL HIGH (ref 0.76–1.27)
GFR calc Af Amer: 29 mL/min/{1.73_m2} — ABNORMAL LOW (ref >59)
GFR calc non Af Amer: 25 mL/min/{1.73_m2} — ABNORMAL LOW (ref >59)
GLUCOSE: 91 mg/dL (ref 65–99)
POTASSIUM: 5.1 mmol/L (ref 3.5–5.2)
Sodium: 143 mmol/L (ref 134–144)

## 2014-01-08 LAB — NMR, LIPOPROFILE
CHOLESTEROL: 150 mg/dL (ref 100–199)
HDL CHOLESTEROL BY NMR: 53 mg/dL (ref >39)
HDL Particle Number: 28.8 umol/L — ABNORMAL LOW (ref >=30.5)
LDL PARTICLE NUMBER: 631 nmol/L (ref <1000)
LDL Size: 20.7 nm (ref >20.5)
LDLC SERPL CALC-MCNC: 75 mg/dL (ref 0–99)
LP-IR Score: <25 (ref <=45)
Small LDL Particle Number: 244 nmol/L (ref <=527)
TRIGLYCERIDES BY NMR: 108 mg/dL (ref 0–149)

## 2014-01-08 LAB — VITAMIN D 25 HYDROXY (VIT D DEFICIENCY, FRACTURES): Vit D, 25-Hydroxy: 27.4 ng/mL — ABNORMAL LOW (ref 30.0–100.0)

## 2014-01-08 LAB — HEPATIC FUNCTION PANEL
ALT: 13 IU/L (ref 0–44)
AST: 18 IU/L (ref 0–40)
Albumin: 4.3 g/dL (ref 3.5–4.7)
Alkaline Phosphatase: 78 IU/L (ref 39–117)
BILIRUBIN DIRECT: 0.11 mg/dL (ref 0.00–0.40)
TOTAL PROTEIN: 6.8 g/dL (ref 6.0–8.5)
Total Bilirubin: 0.6 mg/dL (ref 0.0–1.2)

## 2014-01-09 ENCOUNTER — Telehealth: Payer: Self-pay | Admitting: Family Medicine

## 2014-01-09 NOTE — Telephone Encounter (Signed)
Message copied by Waverly Ferrari on Thu Jan 09, 2014 11:37 AM ------      Message from: Chipper Herb      Created: Wed Jan 08, 2014 10:03 PM       The blood sugar is good at 91.      The creatinine is the highest that it has ever been at 2.36. The GFR is 25. Serum calcium was also elevated. This is very very important.++++++++++ please make sure that the patient's nephrologist gets a copy of this report as soon as possible---Dr Laurienti -in [place]++++++++      LFTs are within normal limits      The vitamin D level is low --no treatment changes because of elevated creatinine      All cholesterol numbers are excellent-----continue fish oil and as aggressive therapeutic lifestyle changes as possible ------

## 2014-05-12 ENCOUNTER — Ambulatory Visit (INDEPENDENT_AMBULATORY_CARE_PROVIDER_SITE_OTHER): Payer: Medicare Other

## 2014-05-12 DIAGNOSIS — Z23 Encounter for immunization: Secondary | ICD-10-CM

## 2014-05-16 ENCOUNTER — Encounter: Payer: Self-pay | Admitting: *Deleted

## 2014-05-28 ENCOUNTER — Other Ambulatory Visit: Payer: Self-pay | Admitting: Family Medicine

## 2014-07-14 ENCOUNTER — Ambulatory Visit: Payer: Medicare Other | Admitting: Family Medicine

## 2014-07-17 ENCOUNTER — Ambulatory Visit (INDEPENDENT_AMBULATORY_CARE_PROVIDER_SITE_OTHER): Payer: Medicare Other | Admitting: Family Medicine

## 2014-07-17 ENCOUNTER — Encounter: Payer: Self-pay | Admitting: Family Medicine

## 2014-07-17 VITALS — BP 142/90 | HR 81 | Temp 98.6°F | Ht 71.5 in | Wt 179.0 lb

## 2014-07-17 DIAGNOSIS — N189 Chronic kidney disease, unspecified: Secondary | ICD-10-CM

## 2014-07-17 DIAGNOSIS — E785 Hyperlipidemia, unspecified: Secondary | ICD-10-CM

## 2014-07-17 DIAGNOSIS — E039 Hypothyroidism, unspecified: Secondary | ICD-10-CM

## 2014-07-17 DIAGNOSIS — C61 Malignant neoplasm of prostate: Secondary | ICD-10-CM

## 2014-07-17 DIAGNOSIS — I10 Essential (primary) hypertension: Secondary | ICD-10-CM

## 2014-07-17 DIAGNOSIS — E559 Vitamin D deficiency, unspecified: Secondary | ICD-10-CM

## 2014-07-17 MED ORDER — AMLODIPINE BESYLATE 10 MG PO TABS: ORAL_TABLET | ORAL | Status: AC

## 2014-07-17 NOTE — Progress Notes (Signed)
Subjective:    Patient ID: Ivan West, male    DOB: Sep 08, 1930, 80 y.o.   MRN: [id number]  HPI Pt here for follow up and management of chronic medical problems. The patient is followed by multiple specialists. He has a history of prostate cancer with elevated PSA, coronary artery disease, chronic kidney disease, hypothyroidism and gout. The cold intolerance has been going on for years. The patient had his pacemaker replaced the summer without any problems. He continues to take ULoric 80 mg.         Patient Active Problem List   Diagnosis Date Noted  . Hypothyroidism 07/08/2013  . Hyperlipidemia 07/08/2013  . Elevated PSA 07/08/2013  . Prostate cancer 07/08/2013  . CAD (coronary artery disease) 07/08/2013  . Hypertension 01/29/2013  . Elevated serum creatinine 01/29/2013  . Gout 01/29/2013   Outpatient Encounter Prescriptions as of 07/17/2014  Medication Sig  . amLODipine (NORVASC) 10 MG tablet TAKE (1) TABLET DAILY  . aspirin 81 MG tablet Take 81 mg by mouth daily.  . carvedilol (COREG) 12.5 MG tablet Take 12.5 mg by mouth 2 (two) times daily with a meal.  . febuxostat (ULORIC) 40 MG tablet Take 80 mg by mouth daily.  . fish oil-omega-3 fatty acids 1000 MG capsule Take 1 g by mouth daily.  . furosemide (LASIX) 20 MG tablet TAKE 1 TABLET AS DIRECTED AS NEEDED.  Marland Kitchen levothyroxine (SYNTHROID, LEVOTHROID) 75 MCG tablet TAKE 1 TABLET BY MOUTH DAILY  . [DISCONTINUED] cholecalciferol (VITAMIN D) 1000 UNITS tablet Take 1,000 Units by mouth daily.    Review of Systems  Constitutional: Positive for chills (cold intolerance).  HENT: Negative.   Eyes: Negative.   Respiratory: Negative.   Cardiovascular: Negative.   Gastrointestinal: Negative.   Endocrine: Negative.   Genitourinary: Negative.   Musculoskeletal: Negative.   Skin: Negative.   Allergic/Immunologic: Negative.   Neurological: Negative.   Hematological: Negative.   Psychiatric/Behavioral: Negative.          Objective:   Physical Exam  Constitutional: He is oriented to person, place, and time. He appears well-developed and well-nourished. No distress.  HENT:  Head: Normocephalic and atraumatic.  Right Ear: External ear normal.  Left Ear: External ear normal.  Nose: Nose normal.  Mouth/Throat: Oropharynx is clear and moist. No oropharyngeal exudate.  Eyes: Conjunctivae and EOM are normal. Pupils are equal, round, and reactive to light. Right eye exhibits no discharge. Left eye exhibits no discharge. No scleral icterus.  Neck: Normal range of motion. Neck supple. No thyromegaly present.  Cardiovascular: Normal rate, regular rhythm, normal heart sounds and intact distal pulses.  Exam reveals no gallop and no friction rub.   No murmur heard. The rhythm is regular at 72/m.  Pulmonary/Chest: Effort normal and breath sounds normal. No respiratory distress. He has no wheezes. He has no rales. He exhibits no tenderness.  Abdominal: Soft. Bowel sounds are normal. He exhibits no mass. There is no tenderness. There is no rebound and no guarding.  Musculoskeletal: Normal range of motion. He exhibits no edema or tenderness.  Lymphadenopathy:    He has no cervical adenopathy.  Neurological: He is alert and oriented to person, place, and time. He has normal reflexes. No cranial nerve deficit.  Skin: Skin is warm and dry. No rash noted. No erythema. No pallor.  Psychiatric: He has a normal mood and affect. His behavior is normal. Judgment and thought content normal.  Nursing note and vitals reviewed.  BP 142/90 mmHg  Pulse 81  Temp(Src) 98.6 F (37 C) (Oral)  Ht 5' 11.5" (1.816 m)  Wt 179 lb (81.194 kg)  BMI 24.62 kg/m2        Assessment & Plan:  1. Essential hypertension - POCT CBC; Future - BMP8+EGFR; Future - Hepatic function panel; Future  2. Hypothyroidism, unspecified hypothyroidism type - POCT CBC; Future - Thyroid Panel With TSH; Future  3. Hyperlipidemia - POCT CBC; Future -  NMR, lipoprofile; Future  4. Vitamin D deficiency - POCT CBC; Future - Vit D  25 hydroxy (rtn osteoporosis monitoring); Future  5. Prostate cancer - POCT CBC; Future - BMP8+EGFR; Future - Hepatic function panel; Future - NMR, lipoprofile; Future - Vit D  25 hydroxy (rtn osteoporosis monitoring); Future - Thyroid Panel With TSH; Future  6. CKD (chronic kidney disease), unspecified stage  Patient Instructions                       Medicare Annual Wellness Visit  [facility] and the medical providers at [facility] strive to bring you the best medical care.  In doing so we not only want to address your current medical conditions and concerns but also to detect new conditions early and prevent illness, disease and health-related problems.    Medicare offers a yearly Wellness Visit which allows our clinical staff to assess your need for preventative services including immunizations, lifestyle education, counseling to decrease risk of preventable diseases and screening for fall risk and other medical concerns.    This visit is provided free of charge (no copay) for all Medicare recipients. The clinical pharmacists at [facility] have begun to conduct these Wellness Visits which will also include a thorough review of all your medications.    As you primary medical provider recommend that you make an appointment for your Annual Wellness Visit if you have not done so already this year.  You may set up this appointment before you leave today or you may call back [phone]) and schedule an appointment.  Please make sure when you call that you mention that you are scheduling your Annual Wellness Visit with the clinical pharmacist so that the appointment may be made for the proper length of time.     Continue current medications. Continue good therapeutic lifestyle changes which include good diet and exercise. Fall precautions discussed with patient. If  an FOBT was given today- please return it to our front desk. If you are over 47 years old - you may need Prevnar 67 or the adult Pneumonia vaccine.  Flu Shots will be available at our office starting mid- September. Please call and schedule a FLU CLINIC APPOINTMENT.   Continue follow-up with urology, nephrology, cardiology, and rheumatology as planned.   Arrie Senate MD

## 2014-07-17 NOTE — Addendum Note (Signed)
Addended by: Chipper Herb on: 07/17/2014 04:13 PM   Modules accepted: Orders

## 2014-07-17 NOTE — Patient Instructions (Addendum)
Medicare Annual Wellness Visit  [facility] and the medical providers at [facility] strive to bring you the best medical care.  In doing so we not only want to address your current medical conditions and concerns but also to detect new conditions early and prevent illness, disease and health-related problems.    Medicare offers a yearly Wellness Visit which allows our clinical staff to assess your need for preventative services including immunizations, lifestyle education, counseling to decrease risk of preventable diseases and screening for fall risk and other medical concerns.    This visit is provided free of charge (no copay) for all Medicare recipients. The clinical pharmacists at [facility] have begun to conduct these Wellness Visits which will also include a thorough review of all your medications.    As you primary medical provider recommend that you make an appointment for your Annual Wellness Visit if you have not done so already this year.  You may set up this appointment before you leave today or you may call back [phone] and schedule an appointment.  Please make sure when you call that you mention that you are scheduling your Annual Wellness Visit with the clinical pharmacist so that the appointment may be made for the proper length of time.     Continue current medications. Continue good therapeutic lifestyle changes which include good diet and exercise. Fall precautions discussed with patient. If an FOBT was given today- please return it to our front desk. If you are over 70 years old - you may need Prevnar 2 or the adult Pneumonia vaccine.  Flu Shots will be available at our office starting mid- September. Please call and schedule a FLU CLINIC APPOINTMENT.   Continue follow-up with urology, nephrology, cardiology, and rheumatology as planned.

## 2014-07-22 ENCOUNTER — Other Ambulatory Visit: Payer: Medicare Other

## 2014-07-22 ENCOUNTER — Other Ambulatory Visit (INDEPENDENT_AMBULATORY_CARE_PROVIDER_SITE_OTHER): Payer: Medicare Other

## 2014-07-22 DIAGNOSIS — E785 Hyperlipidemia, unspecified: Secondary | ICD-10-CM

## 2014-07-22 DIAGNOSIS — Z1212 Encounter for screening for malignant neoplasm of rectum: Secondary | ICD-10-CM

## 2014-07-22 DIAGNOSIS — C61 Malignant neoplasm of prostate: Secondary | ICD-10-CM

## 2014-07-22 DIAGNOSIS — I10 Essential (primary) hypertension: Secondary | ICD-10-CM

## 2014-07-22 DIAGNOSIS — E039 Hypothyroidism, unspecified: Secondary | ICD-10-CM

## 2014-07-22 DIAGNOSIS — E559 Vitamin D deficiency, unspecified: Secondary | ICD-10-CM

## 2014-07-22 LAB — POCT CBC
GRANULOCYTE PERCENT: 64.6 % (ref 37–80)
HEMATOCRIT: 43 % — AB (ref 43.5–53.7)
Hemoglobin: 13.8 g/dL — AB (ref 14.1–18.1)
Lymph, poc: 1.2 (ref 0.6–3.4)
MCH, POC: 26.4 pg — AB (ref 27–31.2)
MCHC: 32 g/dL (ref 31.8–35.4)
MCV: 82.6 fL (ref 80–97)
MPV: 7.6 fL (ref 0–99.8)
POC GRANULOCYTE: 2.5 (ref 2–6.9)
POC LYMPH PERCENT: 30.7 %L (ref 10–50)
Platelet Count, POC: 215 10*3/uL (ref 142–424)
RBC: 5.2 M/uL (ref 4.69–6.13)
RDW, POC: 14.5 %
WBC: 3.9 10*3/uL — AB (ref 4.6–10.2)

## 2014-07-22 NOTE — Progress Notes (Signed)
Lab only 

## 2014-07-23 ENCOUNTER — Telehealth: Payer: Self-pay

## 2014-07-23 LAB — BMP8+EGFR
BUN / CREAT RATIO: 13 (ref 10–22)
BUN: 27 mg/dL (ref 8–27)
CHLORIDE: 103 mmol/L (ref 97–108)
CO2: 25 mmol/L (ref 18–29)
CREATININE: 2.13 mg/dL — AB (ref 0.76–1.27)
Calcium: 10.4 mg/dL — ABNORMAL HIGH (ref 8.6–10.2)
GFR calc Af Amer: 33 mL/min/{1.73_m2} — ABNORMAL LOW (ref >59)
GFR calc non Af Amer: 28 mL/min/{1.73_m2} — ABNORMAL LOW (ref >59)
GLUCOSE: 99 mg/dL (ref 65–99)
Potassium: 4.9 mmol/L (ref 3.5–5.2)
Sodium: 143 mmol/L (ref 134–144)

## 2014-07-23 LAB — HEPATIC FUNCTION PANEL
ALBUMIN: 4.2 g/dL (ref 3.5–4.7)
ALT: 17 IU/L (ref 0–44)
AST: 21 IU/L (ref 0–40)
Alkaline Phosphatase: 73 IU/L (ref 39–117)
BILIRUBIN TOTAL: 0.4 mg/dL (ref 0.0–1.2)
Bilirubin, Direct: 0.12 mg/dL (ref 0.00–0.40)
TOTAL PROTEIN: 7.1 g/dL (ref 6.0–8.5)

## 2014-07-23 LAB — NMR, LIPOPROFILE
CHOLESTEROL: 158 mg/dL (ref 100–199)
HDL Cholesterol by NMR: 49 mg/dL (ref >39)
HDL PARTICLE NUMBER: 27.8 umol/L — AB (ref >=30.5)
LDL Particle Number: 779 nmol/L (ref <1000)
LDL Size: 19.7 nm (ref >20.5)
LDL-C: 88 mg/dL (ref 0–99)
LP-IR Score: 36 (ref <=45)
SMALL LDL PARTICLE NUMBER: 472 nmol/L (ref <=527)
Triglycerides by NMR: 103 mg/dL (ref 0–149)

## 2014-07-23 LAB — VITAMIN D 25 HYDROXY (VIT D DEFICIENCY, FRACTURES): VIT D 25 HYDROXY: 26 ng/mL — AB (ref 30.0–100.0)

## 2014-07-23 LAB — THYROID PANEL WITH TSH
FREE THYROXINE INDEX: 2.8 (ref 1.2–4.9)
T3 UPTAKE RATIO: 33 % (ref 24–39)
T4 TOTAL: 8.4 ug/dL (ref 4.5–12.0)
TSH: 1.11 u[IU]/mL (ref 0.450–4.500)

## 2014-07-23 NOTE — Telephone Encounter (Signed)
Letter sent with results; Results faxed

## 2014-07-23 NOTE — Telephone Encounter (Signed)
-----   Message from Chipper Herb, MD sent at 07/23/2014  3:06 PM EST ----- The blood sugar is good at 99. The creatinine remains elevated at 2.13 and this is consistent with readings over 1 year ago. Please send a copy of this report to Dr. Wyvonnia Lora, the patient's nephrologist in [place].+++++. the electrolytes are within normal limits except the serum calcium is slightly elevated at 10.4 and this is consistent with past readings. All liver function tests are within normal limits The advanced lipid panel has an LDL particle number that is at goal at 779. The LDL C is good at 88. The triglycerides are good at 103. The good cholesterol or the HDL particle number remains low.--- Continue with Fish oil and aggressive therapeutic lifestyle changes which include diet and exercise The vitamin D level remains low at no further treatment with this because of his elevated creatinine All thyroid function tests are within normal limits Please send a copy of this lab work to his cardiologist Dr. Shirlee More, his nephrologist Dr. Wyvonnia Lora, and his urologist, Dr. Ree Edman, and his rheumatologist Dr. Esperanza Sheets

## 2014-07-24 LAB — FECAL OCCULT BLOOD, IMMUNOCHEMICAL: Fecal Occult Bld: NEGATIVE

## 2014-07-28 ENCOUNTER — Encounter: Payer: Self-pay | Admitting: *Deleted

## 2014-11-18 ENCOUNTER — Other Ambulatory Visit: Payer: Self-pay | Admitting: Family Medicine

## 2015-01-22 ENCOUNTER — Ambulatory Visit (INDEPENDENT_AMBULATORY_CARE_PROVIDER_SITE_OTHER): Payer: Medicare Other | Admitting: Family Medicine

## 2015-01-22 ENCOUNTER — Encounter: Payer: Self-pay | Admitting: Family Medicine

## 2015-01-22 VITALS — BP 132/83 | HR 63 | Temp 97.0°F | Ht 71.5 in | Wt 175.0 lb

## 2015-01-22 DIAGNOSIS — E785 Hyperlipidemia, unspecified: Secondary | ICD-10-CM | POA: Diagnosis not present

## 2015-01-22 DIAGNOSIS — I739 Peripheral vascular disease, unspecified: Secondary | ICD-10-CM | POA: Diagnosis not present

## 2015-01-22 DIAGNOSIS — E559 Vitamin D deficiency, unspecified: Secondary | ICD-10-CM | POA: Insufficient documentation

## 2015-01-22 DIAGNOSIS — E039 Hypothyroidism, unspecified: Secondary | ICD-10-CM

## 2015-01-22 DIAGNOSIS — H6122 Impacted cerumen, left ear: Secondary | ICD-10-CM | POA: Diagnosis not present

## 2015-01-22 DIAGNOSIS — N189 Chronic kidney disease, unspecified: Secondary | ICD-10-CM | POA: Insufficient documentation

## 2015-01-22 DIAGNOSIS — M1000 Idiopathic gout, unspecified site: Secondary | ICD-10-CM | POA: Diagnosis not present

## 2015-01-22 DIAGNOSIS — I10 Essential (primary) hypertension: Secondary | ICD-10-CM | POA: Diagnosis not present

## 2015-01-22 DIAGNOSIS — N184 Chronic kidney disease, stage 4 (severe): Secondary | ICD-10-CM | POA: Diagnosis not present

## 2015-01-22 DIAGNOSIS — C61 Malignant neoplasm of prostate: Secondary | ICD-10-CM | POA: Diagnosis not present

## 2015-01-22 LAB — POCT CBC
Granulocyte percent: 61.4 %G (ref 37–80)
HEMATOCRIT: 44.7 % (ref 43.5–53.7)
Hemoglobin: 14.2 g/dL (ref 14.1–18.1)
Lymph, poc: 1 (ref 0.6–3.4)
MCH, POC: 26.2 pg — AB (ref 27–31.2)
MCHC: 31.7 g/dL — AB (ref 31.8–35.4)
MCV: 82.5 fL (ref 80–97)
MPV: 7.9 fL (ref 0–99.8)
PLATELET COUNT, POC: 180 10*3/uL (ref 142–424)
POC Granulocyte: 2 (ref 2–6.9)
POC LYMPH %: 32.2 % (ref 10–50)
RBC: 5.42 M/uL (ref 4.69–6.13)
RDW, POC: 14.4 %
WBC: 3.2 10*3/uL — AB (ref 4.6–10.2)

## 2015-01-22 NOTE — Patient Instructions (Addendum)
Medicare Annual Wellness Visit  [facility] and the medical providers at [facility] strive to bring you the best medical care.  In doing so we not only want to address your current medical conditions and concerns but also to detect new conditions early and prevent illness, disease and health-related problems.    Medicare offers a yearly Wellness Visit which allows our clinical staff to assess your need for preventative services including immunizations, lifestyle education, counseling to decrease risk of preventable diseases and screening for fall risk and other medical concerns.    This visit is provided free of charge (no copay) for all Medicare recipients. The clinical pharmacists at [facility] have begun to conduct these Wellness Visits which will also include a thorough review of all your medications.    As you primary medical provider recommend that you make an appointment for your Annual Wellness Visit if you have not done so already this year.  You may set up this appointment before you leave today or you may call back [phone] and schedule an appointment.  Please make sure when you call that you mention that you are scheduling your Annual Wellness Visit with the clinical pharmacist so that the appointment may be made for the proper length of time.     Continue current medications. Continue good therapeutic lifestyle changes which include good diet and exercise. Fall precautions discussed with patient. If an FOBT was given today- please return it to our front desk. If you are over 14 years old - you may need Prevnar 76 or the adult Pneumonia vaccine.  Flu Shots are still available at our office. If you still haven't had one please call to set up a nurse visit to get one.   After your visit with [place] today you will receive a survey in the mail or online from [employer] regarding your care with [place]. Please take a moment to  fill this out. Your feedback is very important to [place] as you can help [place] better understand your patient needs as well as improve your experience and satisfaction. WE CARE ABOUT YOU!!!   The patient should continue with his follow-up with the specialist and follow their recommendations. He should stay active but he should also be cautioned to drink plenty of fluids and avoid the heat of the day and not do anything that would increase his risk of falling

## 2015-01-22 NOTE — Progress Notes (Signed)
Subjective:    Patient ID: Ivan West, male    DOB: 11-11-1930, 81 y.o.   MRN: [id number]  HPI Pt here for follow up and management of chronic medical problems which includes hypertension, hyperlipidemia, and hypothyroid. He is also being followed for prostate cancer. It is important to note that his last creatinine was 2.13 and he is being followed by the nephrologist for this. He is also been seen by the rheumatologist and is followed by the cardiologist. He sees all of these specialists regularly. We will list them in the care team section of the chart today. He is taking medications regularly. The patient denies chest pain shortness of breath trouble swallowing black tarry stools blood in the stools and only has occasional heartburn which is no more than usual. He's had a history of a prostatectomy because of the prostate cancer and does have occasional leakage. He stays active and to see him you would think he was more like 81 years old and set of soon to be 76. He is alert and pleasant.     Patient Active Problem List   Diagnosis Date Noted  . Hypothyroidism 07/08/2013  . Hyperlipidemia 07/08/2013  . Elevated PSA 07/08/2013  . Prostate cancer 07/08/2013  . CAD (coronary artery disease) 07/08/2013  . Hypertension 01/29/2013  . Elevated serum creatinine 01/29/2013  . Gout 01/29/2013   Outpatient Encounter Prescriptions as of 01/22/2015  Medication Sig  . amLODipine (NORVASC) 10 MG tablet TAKE (1) TABLET DAILY  . aspirin 81 MG tablet Take 81 mg by mouth daily.  . carvedilol (COREG) 12.5 MG tablet Take 12.5 mg by mouth 2 (two) times daily with a meal.  . febuxostat (ULORIC) 40 MG tablet Take 80 mg by mouth daily.  . fish oil-omega-3 fatty acids 1000 MG capsule Take 1 g by mouth daily.  . furosemide (LASIX) 20 MG tablet TAKE 1 TABLET AS DIRECTED AS NEEDED.  Marland Kitchen levothyroxine (SYNTHROID, LEVOTHROID) 75 MCG tablet TAKE 1 TABLET BY MOUTH DAILY  . mirabegron ER (MYRBETRIQ) 50 MG TB24  tablet Take 50 mg by mouth.   No facility-administered encounter medications on file as of 01/22/2015.      Review of Systems  Constitutional: Negative.   HENT: Negative.   Eyes: Negative.   Respiratory: Negative.   Cardiovascular: Negative.   Gastrointestinal: Negative.   Endocrine: Negative.   Genitourinary: Negative.   Musculoskeletal: Negative.   Skin: Negative.   Allergic/Immunologic: Negative.   Neurological: Negative.   Hematological: Negative.   Psychiatric/Behavioral: Negative.        Objective:   Physical Exam  Constitutional: He is oriented to person, place, and time. He appears well-developed and well-nourished. No distress.  HENT:  Head: Normocephalic and atraumatic.  Right Ear: External ear normal.  Nose: Nose normal.  Mouth/Throat: Oropharynx is clear and moist. No oropharyngeal exudate.  Left ear canal cerumen  Eyes: Conjunctivae and EOM are normal. Pupils are equal, round, and reactive to light. Right eye exhibits no discharge. Left eye exhibits no discharge. No scleral icterus.  Recent eye exam and new glasses  Neck: Normal range of motion. Neck supple. No thyromegaly present.  Without bruits thyromegaly or adenopathy  Cardiovascular: Normal rate, regular rhythm, normal heart sounds and intact distal pulses.  Exam reveals no gallop and no friction rub.   No murmur heard. At 84/m distal pulses were barely palpable but the dorsalis pedis was palpable bilaterally. The patient describes no claudication symptoms.  Pulmonary/Chest: Effort normal and breath sounds  normal. No respiratory distress. He has no wheezes. He has no rales. He exhibits no tenderness.  Clear anteriorly and posteriorly and no axillary adenopathy  Abdominal: Soft. Bowel sounds are normal. He exhibits no mass. There is no tenderness. There is no rebound and no guarding.  A lot of gaseous distention without masses tenderness or organ enlargement or bruits  Musculoskeletal: Normal range of  motion. He exhibits no edema or tenderness.  Lymphadenopathy:    He has no cervical adenopathy.  Neurological: He is alert and oriented to person, place, and time. He has normal reflexes. No cranial nerve deficit.  Skin: Skin is warm and dry. No rash noted. No erythema. No pallor.  Psychiatric: He has a normal mood and affect. His behavior is normal. Judgment and thought content normal.  Nursing note and vitals reviewed.  BP 140/91 mmHg  Pulse 89  Temp(Src) 97 F (36.1 C) (Oral)  Ht 5' 11.5" (1.816 m)  Wt 175 lb (79.379 kg)  BMI 24.07 kg/m2        Assessment & Plan:  1. Essential hypertension -The blood pressure is slightly elevated today and this will be rechecked before he leaves office. - POCT CBC - BMP8+EGFR - Hepatic function panel  2. Hypothyroidism, unspecified hypothyroidism type -The patient is having no symptoms with this and he will continue with current treatment - POCT CBC - Thyroid Panel With TSH  3. Hyperlipidemia -He will continue with his omega-3 fatty acids pending results of lab work - POCT CBC - NMR, lipoprofile  4. Vitamin D deficiency -He is not taking any vitamin D at this point in time because of his renal insufficiency - POCT CBC - Vit D  25 hydroxy (rtn osteoporosis monitoring)  5. Prostate cancer -He is followed regularly by the urologist for this. - POCT CBC  6. CKD (chronic kidney disease), stage 4 (severe) -He is followed regularly by the nephrologist for this. - POCT CBC - BMP8+EGFR  7. Peripheral vascular insufficiency -He is having no claudication symptoms but we discussed what those would be if he had them and he will get back with [place] if he has any problems with circulation.  8. Idiopathic gout, unspecified chronicity, unspecified site -He is been followed regularly by the rheumatologist.  9. Left ear impacted cerumen -He will get an ear wash today to  remove the cerumen  Patient Instructions                       Medicare  Annual Wellness Visit  [facility] and the medical providers at [facility] strive to bring you the best medical care.  In doing so we not only want to address your current medical conditions and concerns but also to detect new conditions early and prevent illness, disease and health-related problems.    Medicare offers a yearly Wellness Visit which allows our clinical staff to assess your need for preventative services including immunizations, lifestyle education, counseling to decrease risk of preventable diseases and screening for fall risk and other medical concerns.    This visit is provided free of charge (no copay) for all Medicare recipients. The clinical pharmacists at [facility] have begun to conduct these Wellness Visits which will also include a thorough review of all your medications.    As you primary medical provider recommend that you make an appointment for your Annual Wellness Visit if you have not done so already this year.  You may set  up this appointment before you leave today or you may call back ([phone]) and schedule an appointment.  Please make sure when you call that you mention that you are scheduling your Annual Wellness Visit with the clinical pharmacist so that the appointment may be made for the proper length of time.     Continue current medications. Continue good therapeutic lifestyle changes which include good diet and exercise. Fall precautions discussed with patient. If an FOBT was given today- please return it to our front desk. If you are over 50 years old - you may need Prevnar 56 or the adult Pneumonia vaccine.  Flu Shots are still available at our office. If you still haven't had one please call to set up a nurse visit to get one.   After your visit with [place] today you will receive a survey in the mail or online from [employer] regarding your care with [place]. Please take a moment to fill this out. Your feedback is  very important to [place] as you can help [place] better understand your patient needs as well as improve your experience and satisfaction. WE CARE ABOUT YOU!!!   The patient should continue with his follow-up with the specialist and follow their recommendations. He should stay active but he should also be cautioned to drink plenty of fluids and avoid the heat of the day and not do anything that would increase his risk of falling   Arrie Senate MD

## 2015-01-23 LAB — HEPATIC FUNCTION PANEL
ALK PHOS: 77 IU/L (ref 39–117)
ALT: 11 IU/L (ref 0–44)
AST: 16 IU/L (ref 0–40)
Albumin: 4.5 g/dL (ref 3.5–4.7)
Bilirubin Total: 0.5 mg/dL (ref 0.0–1.2)
Bilirubin, Direct: 0.12 mg/dL (ref 0.00–0.40)
TOTAL PROTEIN: 7.3 g/dL (ref 6.0–8.5)

## 2015-01-23 LAB — NMR, LIPOPROFILE
Cholesterol: 152 mg/dL (ref 100–199)
HDL Cholesterol by NMR: 51 mg/dL (ref >39)
HDL PARTICLE NUMBER: 25.3 umol/L — AB (ref >=30.5)
LDL PARTICLE NUMBER: 715 nmol/L (ref <1000)
LDL Size: 21 nm (ref >20.5)
LDL-C: 82 mg/dL (ref 0–99)
LP-IR SCORE: 36 (ref <=45)
Small LDL Particle Number: 148 nmol/L (ref <=527)
Triglycerides by NMR: 96 mg/dL (ref 0–149)

## 2015-01-23 LAB — BMP8+EGFR
BUN/Creatinine Ratio: 13 (ref 10–22)
BUN: 31 mg/dL — AB (ref 8–27)
CALCIUM: 10.9 mg/dL — AB (ref 8.6–10.2)
CO2: 22 mmol/L (ref 18–29)
Chloride: 105 mmol/L (ref 97–108)
Creatinine, Ser: 2.34 mg/dL — ABNORMAL HIGH (ref 0.76–1.27)
GFR calc Af Amer: 29 mL/min/{1.73_m2} — ABNORMAL LOW (ref >59)
GFR calc non Af Amer: 25 mL/min/{1.73_m2} — ABNORMAL LOW (ref >59)
Glucose: 104 mg/dL — ABNORMAL HIGH (ref 65–99)
Potassium: 4.9 mmol/L (ref 3.5–5.2)
Sodium: 144 mmol/L (ref 134–144)

## 2015-01-23 LAB — VITAMIN D 25 HYDROXY (VIT D DEFICIENCY, FRACTURES): VIT D 25 HYDROXY: 24 ng/mL — AB (ref 30.0–100.0)

## 2015-01-23 LAB — THYROID PANEL WITH TSH
Free Thyroxine Index: 2.5 (ref 1.2–4.9)
T3 Uptake Ratio: 30 % (ref 24–39)
T4, Total: 8.3 ug/dL (ref 4.5–12.0)
TSH: 0.585 u[IU]/mL (ref 0.450–4.500)

## 2015-01-26 ENCOUNTER — Telehealth: Payer: Self-pay | Admitting: *Deleted

## 2015-01-26 NOTE — Telephone Encounter (Signed)
lmtcb regarding test results. 

## 2015-01-26 NOTE — Telephone Encounter (Signed)
-----   Message from Chipper Herb, MD sent at 01/25/2015 12:24 PM EDT ----- Please make sure that the patient gets a copy of all of this blood work to take to his specialists in [place]  The blood sugar is elevated at 104. The creatinine is stable at 2.34 and this of course remains elevated. The electrolytes including potassium are good but the serum calcium remains elevated at 10.9. All liver function tests are within normal limits Restoril numbers with advanced lipid testing are good and within normal limits except the good cholesterol or the HDL particle number remains low and this is consistent with past readings.----- the patient should continue with his omega-3 fatty acids or Fish oil. And also with his therapeutic lifestyle changes which include diet and exercise The vitamin D level remains low but because of his elevated creatinine no vitamin D is to be taken. All thyroid function tests are within normal limits and the patient should continue with his current dose of levothyroxine

## 2015-03-27 ENCOUNTER — Telehealth: Payer: Self-pay | Admitting: Family Medicine

## 2015-03-27 NOTE — Telephone Encounter (Signed)
Patients wife was questioning patients fish oil and states that cardiologist suggested he decrease fish oil to 500mg . Patients wife advised to follow cardiologist office recommendation.

## 2015-05-13 ENCOUNTER — Ambulatory Visit (INDEPENDENT_AMBULATORY_CARE_PROVIDER_SITE_OTHER): Payer: Medicare Other

## 2015-05-13 ENCOUNTER — Encounter (INDEPENDENT_AMBULATORY_CARE_PROVIDER_SITE_OTHER): Payer: Self-pay

## 2015-05-13 DIAGNOSIS — Z23 Encounter for immunization: Secondary | ICD-10-CM | POA: Diagnosis not present

## 2015-06-08 ENCOUNTER — Encounter: Payer: Self-pay | Admitting: Family Medicine

## 2015-06-08 ENCOUNTER — Ambulatory Visit (INDEPENDENT_AMBULATORY_CARE_PROVIDER_SITE_OTHER): Payer: Medicare Other | Admitting: Family Medicine

## 2015-06-08 VITALS — BP 130/83 | HR 67 | Temp 96.7°F | Ht 71.5 in | Wt 173.8 lb

## 2015-06-08 DIAGNOSIS — Z23 Encounter for immunization: Secondary | ICD-10-CM | POA: Diagnosis not present

## 2015-06-08 DIAGNOSIS — E039 Hypothyroidism, unspecified: Secondary | ICD-10-CM | POA: Diagnosis not present

## 2015-06-08 DIAGNOSIS — E785 Hyperlipidemia, unspecified: Secondary | ICD-10-CM | POA: Diagnosis not present

## 2015-06-08 DIAGNOSIS — I10 Essential (primary) hypertension: Secondary | ICD-10-CM

## 2015-06-08 DIAGNOSIS — K429 Umbilical hernia without obstruction or gangrene: Secondary | ICD-10-CM | POA: Diagnosis not present

## 2015-06-08 DIAGNOSIS — N184 Chronic kidney disease, stage 4 (severe): Secondary | ICD-10-CM | POA: Diagnosis not present

## 2015-06-08 DIAGNOSIS — E559 Vitamin D deficiency, unspecified: Secondary | ICD-10-CM

## 2015-06-08 DIAGNOSIS — I739 Peripheral vascular disease, unspecified: Secondary | ICD-10-CM | POA: Diagnosis not present

## 2015-06-08 DIAGNOSIS — C61 Malignant neoplasm of prostate: Secondary | ICD-10-CM

## 2015-06-08 NOTE — Patient Instructions (Signed)
Patient will continue to follow-up with his urologist, cardiologist and nephrologist. He sees each of these every 6 months. He has already had his flu shot. We will give him a Pneumovax today and Tdap If this has not been given. He should continue with his regular medicines. He should continue to be careful and  not putting himself at risk for falling This winter he should drink plenty of fluids and keep the house as cool as possible

## 2015-06-08 NOTE — Addendum Note (Signed)
Addended by: Thana Ates on: 06/08/2015 09:34 AM   Modules accepted: Orders

## 2015-06-08 NOTE — Progress Notes (Signed)
 Subjective:    Patient ID: Ivan West, male    DOB: 04/17/1931, 81 y.o.   MRN: [id number]  HPI Patient is here today for a follow up on his chronic medical problems. These problems include gout, chronic kidney disease, vitamin D deficiency, hyperlipidemia and hypothyroidism. He also has a history of prostate cancer and is followed by the urologist. He also has elevated blood pressure. He's been taking his medications regularly and follows up regularly with his specialists to include cardiologist urologist and a nephrologist. He sees each of the specialist about every 6 months. The patient is followed regularly by the nephrologist and is not on an Ace or an arb because of his chronic kidney disease.   Review of Systems  Constitutional: Negative.   HENT: Negative.   Eyes: Negative.   Respiratory: Negative.   Cardiovascular: Negative.   Gastrointestinal: Negative.   Endocrine: Negative.   Genitourinary: Negative.   Musculoskeletal: Negative.   Skin: Negative.   Allergic/Immunologic: Negative.   Neurological: Negative.   Hematological: Negative.   Psychiatric/Behavioral: Negative.           Patient Active Problem List   Diagnosis Date Noted  . Primary gout 01/22/2015  . CKD (chronic kidney disease) 01/22/2015  . Vitamin D deficiency 01/22/2015  . Hypothyroidism 07/08/2013  . Hyperlipidemia 07/08/2013  . Elevated PSA 07/08/2013  . Prostate cancer (HCC) 07/08/2013  . CAD (coronary artery disease) 07/08/2013  . Hypertension 01/29/2013  . Elevated serum creatinine 01/29/2013  . Gout 01/29/2013   Outpatient Encounter Prescriptions as of 06/08/2015  Medication Sig  . amLODipine (NORVASC) 10 MG tablet TAKE (1) TABLET DAILY  . aspirin 81 MG tablet Take 81 mg by mouth daily.  . carvedilol (COREG) 12.5 MG tablet Take 12.5 mg by mouth 2 (two) times daily with a meal.  . febuxostat (ULORIC) 40 MG tablet Take 80 mg by mouth daily.  . fish oil-omega-3 fatty acids 1000 MG capsule  Take 1 g by mouth daily.  . furosemide (LASIX) 20 MG tablet TAKE 1 TABLET AS DIRECTED AS NEEDED.  . levothyroxine (SYNTHROID, LEVOTHROID) 75 MCG tablet TAKE 1 TABLET BY MOUTH DAILY  . [DISCONTINUED] mirabegron ER (MYRBETRIQ) 50 MG TB24 tablet Take 50 mg by mouth.   No facility-administered encounter medications on file as of 06/08/2015.       Objective:   Physical Exam  Constitutional: He is oriented to person, place, and time. He appears well-developed and well-nourished. No distress.  Patient is pleasant smiling alert and looks much younger than his stated age of 81 years  HENT:  Head: Normocephalic and atraumatic.  Nose: Nose normal.  Mouth/Throat: Oropharynx is clear and moist. No oropharyngeal exudate.  Bilateral ears cerumen  Eyes: Conjunctivae and EOM are normal. Pupils are equal, round, and reactive to light. Right eye exhibits no discharge. Left eye exhibits no discharge. No scleral icterus.  Neck: Normal range of motion. Neck supple. No thyromegaly present.  Without bruits thyromegaly or adenopathy  Cardiovascular: Normal rate, regular rhythm and normal heart sounds.   No murmur heard. Heart is regular at 72/m. The pedal pulses were diminished in the right foot and dorsalis pedis was palpable in the left foot. He has good inguinal pulses.  Pulmonary/Chest: Effort normal and breath sounds normal. No respiratory distress. He has no wheezes. He has no rales. He exhibits no tenderness.  Clear anteriorly and posteriorly  Abdominal: Soft. Bowel sounds are normal. He exhibits no mass. There is no tenderness. There is no   rebound and no guarding.  Umbilical hernia is present  Genitourinary:  The patient is followed by the urologist every 6 months for his prostate cancer  Musculoskeletal: Normal range of motion. He exhibits no edema.  Lymphadenopathy:    He has no cervical adenopathy.  Neurological: He is alert and oriented to person, place, and time. He has normal reflexes. No  cranial nerve deficit.  Skin: Skin is warm and dry. No rash noted.  Psychiatric: He has a normal mood and affect. His behavior is normal. Judgment and thought content normal.  Nursing note and vitals reviewed.   BP 130/83 mmHg  Pulse 67  Temp(Src) 96.7 F (35.9 C) (Oral)  Ht 5' 11.5" (1.816 m)  Wt 173 lb 12.8 oz (78.835 kg)  BMI 23.90 kg/m2       Assessment & Plan:  1. Essential hypertension -The blood pressures under good control today and he will continue with current treatment - BMP8+EGFR - CBC with Differential/Platelet  2. Hypothyroidism, unspecified hypothyroidism type -He will continue with current treatment pending results of lab work - Thyroid Panel With TSH  3. Hyperlipidemia -He will continue with current treatment pending results of lab work - NMR, lipoprofile - Hepatic function panel  4. Vitamin D deficiency -He cannot take any vitamin D because of his elevated creatinine - Vit D  25 hydroxy (rtn osteoporosis monitoring)  5. Prostate cancer (HCC) -He will continue to follow-up with the urologist   6. Peripheral vascular insufficiency (HCC) -The patient was encouraged to stay active and walk regularly if he develops any pain in his extremities to get back in touch with us.  7. CKD (chronic kidney disease), stage 4 (severe) (HCC) -He will continue to follow-up with his nephrologist  8. Umbilical hernia without obstruction and without gangrene -We will get in touch with us if he develops any pain  No orders of the defined types were placed in this encounter.   Patient Instructions  Patient will continue to follow-up with his urologist, cardiologist and nephrologist. He sees each of these every 6 months. He has already had his flu shot. We will give him a Pneumovax today and Tdap If this has not been given. He should continue with his regular medicines. He should continue to be careful and  not putting himself at risk for falling This winter he should  drink plenty of fluids and keep the house as cool as possible   Don W. Moore MD   

## 2015-06-19 ENCOUNTER — Other Ambulatory Visit: Payer: Self-pay | Admitting: Family Medicine

## 2015-09-22 DIAGNOSIS — Z950 Presence of cardiac pacemaker: Secondary | ICD-10-CM | POA: Diagnosis not present

## 2015-09-22 DIAGNOSIS — I420 Dilated cardiomyopathy: Secondary | ICD-10-CM | POA: Diagnosis not present

## 2015-10-16 DIAGNOSIS — N3941 Urge incontinence: Secondary | ICD-10-CM | POA: Diagnosis not present

## 2015-10-16 DIAGNOSIS — Z8546 Personal history of malignant neoplasm of prostate: Secondary | ICD-10-CM | POA: Diagnosis not present

## 2015-10-16 DIAGNOSIS — N3946 Mixed incontinence: Secondary | ICD-10-CM | POA: Diagnosis not present

## 2015-10-21 DIAGNOSIS — Z950 Presence of cardiac pacemaker: Secondary | ICD-10-CM | POA: Diagnosis not present

## 2015-10-21 DIAGNOSIS — Z45018 Encounter for adjustment and management of other part of cardiac pacemaker: Secondary | ICD-10-CM | POA: Diagnosis not present

## 2015-10-21 DIAGNOSIS — I429 Cardiomyopathy, unspecified: Secondary | ICD-10-CM | POA: Diagnosis not present

## 2015-10-21 DIAGNOSIS — I442 Atrioventricular block, complete: Secondary | ICD-10-CM | POA: Diagnosis not present

## 2015-10-26 ENCOUNTER — Encounter: Payer: Self-pay | Admitting: Family Medicine

## 2015-10-26 ENCOUNTER — Ambulatory Visit (INDEPENDENT_AMBULATORY_CARE_PROVIDER_SITE_OTHER): Payer: Medicare Other

## 2015-10-26 ENCOUNTER — Ambulatory Visit (INDEPENDENT_AMBULATORY_CARE_PROVIDER_SITE_OTHER): Payer: Medicare Other | Admitting: Family Medicine

## 2015-10-26 VITALS — BP 130/86 | HR 80 | Temp 96.8°F | Ht 71.5 in | Wt 178.0 lb

## 2015-10-26 DIAGNOSIS — I10 Essential (primary) hypertension: Secondary | ICD-10-CM

## 2015-10-26 DIAGNOSIS — E559 Vitamin D deficiency, unspecified: Secondary | ICD-10-CM

## 2015-10-26 DIAGNOSIS — E039 Hypothyroidism, unspecified: Secondary | ICD-10-CM | POA: Diagnosis not present

## 2015-10-26 DIAGNOSIS — N184 Chronic kidney disease, stage 4 (severe): Secondary | ICD-10-CM | POA: Diagnosis not present

## 2015-10-26 DIAGNOSIS — M1000 Idiopathic gout, unspecified site: Secondary | ICD-10-CM | POA: Diagnosis not present

## 2015-10-26 DIAGNOSIS — I739 Peripheral vascular disease, unspecified: Secondary | ICD-10-CM | POA: Diagnosis not present

## 2015-10-26 DIAGNOSIS — C61 Malignant neoplasm of prostate: Secondary | ICD-10-CM

## 2015-10-26 DIAGNOSIS — H6122 Impacted cerumen, left ear: Secondary | ICD-10-CM

## 2015-10-26 DIAGNOSIS — Z1211 Encounter for screening for malignant neoplasm of colon: Secondary | ICD-10-CM

## 2015-10-26 DIAGNOSIS — E785 Hyperlipidemia, unspecified: Secondary | ICD-10-CM | POA: Diagnosis not present

## 2015-10-26 NOTE — Addendum Note (Signed)
Addended by: Zannie Cove on: 10/26/2015 10:32 AM   Modules accepted: Orders

## 2015-10-26 NOTE — Progress Notes (Signed)
Subjective:    Patient ID: Ivan West, male    DOB: 1931/05/17, 82 y.o.   MRN: [id number]  HPI Pt here for follow up and management of chronic medical problems which includes hypertension, hyperlipidemia, and hypothyroid. He is taking medications regularly. This patient is always pleasant and continues to do well overall. He is followed regularly by the cardiologist, the urologist for his prostate cancer, and the nephrologist because of his chronic kidney disease with a GFR of 29 when we last checked this. He denies any specific complaints today. He sees the urologist every 6 months. He will get lab work today. He will be given an FOBT to return. The patient sees each of the specialist every 6 months. He had a prostatectomy years ago and his PSA remains undetectable. He stays active and has no chest discomfort chest pain or shortness of breath. He has no issues with his gastrointestinal tract as far as swallowing heartburn indigestion nausea vomiting diarrhea or blood in the stool. He denies any abdominal pain. He has no problems with passing his water. Patient is active and alert and looks much younger than his stated age. He is good to follow-up with the specialist. The patient denies any complaints with leg pain or cramping with walking. He also denies any problems with his hearing      Patient Active Problem List   Diagnosis Date Noted  . Primary gout 01/22/2015  . CKD (chronic kidney disease) 01/22/2015  . Vitamin D deficiency 01/22/2015  . Hypothyroidism 07/08/2013  . Hyperlipidemia 07/08/2013  . Elevated PSA 07/08/2013  . Prostate cancer ([place]) 07/08/2013  . CAD (coronary artery disease) 07/08/2013  . Hypertension 01/29/2013  . Elevated serum creatinine 01/29/2013  . Gout 01/29/2013   Outpatient Encounter Prescriptions as of 10/26/2015  Medication Sig  . amLODipine (NORVASC) 10 MG tablet TAKE (1) TABLET DAILY  . aspirin 81 MG tablet Take 81 mg by mouth daily.  . carvedilol  (COREG) 12.5 MG tablet Take 12.5 mg by mouth 2 (two) times daily with a meal.  . febuxostat (ULORIC) 40 MG tablet Take 80 mg by mouth daily.  . fish oil-omega-3 fatty acids 1000 MG capsule Take 1 g by mouth daily.  . furosemide (LASIX) 20 MG tablet TAKE 1 TABLET AS DIRECTED AS NEEDED.  Marland Kitchen levothyroxine (SYNTHROID, LEVOTHROID) 75 MCG tablet TAKE 1 TABLET BY MOUTH DAILY   No facility-administered encounter medications on file as of 10/26/2015.     Review of Systems  Constitutional: Negative.   HENT: Negative.   Eyes: Negative.   Respiratory: Negative.   Cardiovascular: Negative.   Gastrointestinal: Negative.   Endocrine: Negative.   Genitourinary: Negative.   Musculoskeletal: Negative.   Skin: Negative.   Allergic/Immunologic: Negative.   Neurological: Negative.   Hematological: Negative.   Psychiatric/Behavioral: Negative.        Objective:   Physical Exam  Constitutional: He is oriented to person, place, and time. He appears well-developed and well-nourished. No distress.  Alert and pleasant  HENT:  Head: Normocephalic and atraumatic.  Right Ear: External ear normal.  Nose: Nose normal.  Mouth/Throat: Oropharynx is clear and moist. No oropharyngeal exudate.  Cerumen impaction left ear canal  Eyes: Conjunctivae and EOM are normal. Pupils are equal, round, and reactive to light. Right eye exhibits no discharge. Left eye exhibits no discharge. No scleral icterus.  Neck: Normal range of motion. Neck supple. No thyromegaly present.  No bruits thyromegaly or anterior cervical adenopathy  Cardiovascular: Normal rate and  normal heart sounds.   No murmur heard. The pulses in both feet are diminished The heart is only slightly irregular at 84/m  Pulmonary/Chest: Effort normal and breath sounds normal. No respiratory distress. He has no wheezes. He has no rales. He exhibits no tenderness.  Clear anteriorly and posteriorly and no axillary adenopathy  Abdominal: Soft. Bowel sounds are  normal. He exhibits no mass. There is no tenderness. There is no rebound and no guarding.  A lot of gas is present with no bruits and no palpable spleen or liver enlargement. He has good inguinal pulses.  Genitourinary:  The patient sees the urologist every 6 months  Musculoskeletal: Normal range of motion. He exhibits no edema or tenderness.  Lymphadenopathy:    He has no cervical adenopathy.  Neurological: He is alert and oriented to person, place, and time. He has normal reflexes. No cranial nerve deficit.  Skin: Skin is warm and dry. No rash noted.  Psychiatric: He has a normal mood and affect. His behavior is normal. Judgment and thought content normal.  Nursing note and vitals reviewed.  BP 130/86 mmHg  Pulse 80  Temp(Src) 96.8 F (36 C) (Oral)  Ht 5' 11.5" (1.816 m)  Wt 178 lb (80.74 kg)  BMI 24.48 kg/m2  [facility] reading (PRIMARY) by  Dr. Moore-chest x-ray-results pending                                       Assessment & Plan:  1. Hypothyroidism, unspecified hypothyroidism type -Continue current treatment pending results of lab work - CBC with Differential/Platelet - Thyroid Panel With TSH  2. Essential hypertension -The blood pressure is minimally elevated today and no change in treatment he should continue to follow-up with the nephrologist - BMP8+EGFR - CBC with Differential/Platelet - Hepatic function panel - DG Chest 2 View; Future  3. Hyperlipidemia -Continue current treatment pending results of lab work - CBC with Differential/Platelet - NMR, lipoprofile - DG Chest 2 View; Future  4. Prostate cancer (HCC) -Continue to follow-up with urologist - CBC with Differential/Platelet  5. Vitamin D deficiency -Continue current treatment pending results of lab work - CBC with Differential/Platelet - VITAMIN D 25 Hydroxy (Vit-D Deficiency, Fractures)  6. Special screening for malignant neoplasms, colon -Return the FOBT - Fecal occult blood, imunochemical;  Future  7. CKD (chronic kidney disease), stage 4 (severe) (HCC) -Continue to follow-up with nephrology  8. Peripheral vascular insufficiency (HCC) -Let us know if any leg pain develops when walking requiring resting  9. Idiopathic gout, unspecified chronicity, unspecified site -This seems to be under good control today  10. Left ear impacted cerumen -Ear irrigation to remove  Patient Instructions                       Medicare Annual Wellness Visit  [place] and the medical providers at [facility] strive to bring you the best medical care.  In doing so we not only want to address your current medical conditions and concerns but also to detect new conditions early and prevent illness, disease and health-related problems.    Medicare offers a yearly Wellness Visit which allows our clinical staff to assess your need for preventative services including immunizations, lifestyle education, counseling to decrease risk of preventable diseases and screening for fall risk and other medical concerns.    This visit is provided free of   charge (no copay) for all Medicare recipients. The clinical pharmacists at [facility] have begun to conduct these Wellness Visits which will also include a thorough review of all your medications.    As you primary medical provider recommend that you make an appointment for your Annual Wellness Visit if you have not done so already this year.  You may set up this appointment before you leave today or you may call back [phone]) and schedule an appointment.  Please make sure when you call that you mention that you are scheduling your Annual Wellness Visit with the clinical pharmacist so that the appointment may be made for the proper length of time.     Continue current medications. Continue good therapeutic lifestyle changes which include good diet and exercise. Fall precautions discussed with patient. If an FOBT was  given today- please return it to our front desk. If you are over 50 years old - you may need Prevnar 13 or the adult Pneumonia vaccine.  **Flu shots are available--- please call and schedule a FLU-CLINIC appointment**  After your visit with us today you will receive a survey in the mail or online from [employer] regarding your care with us. Please take a moment to fill this out. Your feedback is very important to us as you can help us better understand your patient needs as well as improve your experience and satisfaction. WE CARE ABOUT YOU!!!   Continue to follow-up with urology, nephrology, and cardiology Don't forget to get your eye exam Avoid climbing to decrease the risk of falls return the FOBT and we will call you with the lab work and chest x-ray results as soon as they become available   Don W. Moore MD   

## 2015-10-26 NOTE — Patient Instructions (Addendum)
Medicare Annual Wellness Visit  [place] and the medical providers at [facility] strive to bring you the best medical care.  In doing so we not only want to address your current medical conditions and concerns but also to detect new conditions early and prevent illness, disease and health-related problems.    Medicare offers a yearly Wellness Visit which allows our clinical staff to assess your need for preventative services including immunizations, lifestyle education, counseling to decrease risk of preventable diseases and screening for fall risk and other medical concerns.    This visit is provided free of charge (no copay) for all Medicare recipients. The clinical pharmacists at [facility] have begun to conduct these Wellness Visits which will also include a thorough review of all your medications.    As you primary medical provider recommend that you make an appointment for your Annual Wellness Visit if you have not done so already this year.  You may set up this appointment before you leave today or you may call back [phone] and schedule an appointment.  Please make sure when you call that you mention that you are scheduling your Annual Wellness Visit with the clinical pharmacist so that the appointment may be made for the proper length of time.     Continue current medications. Continue good therapeutic lifestyle changes which include good diet and exercise. Fall precautions discussed with patient. If an FOBT was given today- please return it to our front desk. If you are over 20 years old - you may need Prevnar 72 or the adult Pneumonia vaccine.  **Flu shots are available--- please call and schedule a FLU-CLINIC appointment**  After your visit with [place] today you will receive a survey in the mail or online from [employer] regarding your care with [place]. Please take a moment to fill this out. Your feedback is very  important to [place] as you can help [place] better understand your patient needs as well as improve your experience and satisfaction. WE CARE ABOUT YOU!!!   Continue to follow-up with urology, nephrology, and cardiology Don't forget to get your eye exam Avoid climbing to decrease the risk of falls return the FOBT and we will call you with the lab work and chest x-ray results as soon as they become available

## 2015-10-27 LAB — NMR, LIPOPROFILE
CHOLESTEROL: 152 mg/dL (ref 100–199)
HDL Cholesterol by NMR: 48 mg/dL (ref >39)
HDL PARTICLE NUMBER: 28.4 umol/L — AB (ref >=30.5)
LDL PARTICLE NUMBER: 720 nmol/L (ref <1000)
LDL SIZE: 20.7 nm (ref >20.5)
LDL-C: 85 mg/dL (ref 0–99)
LP-IR Score: 52 — ABNORMAL HIGH (ref <=45)
Small LDL Particle Number: 350 nmol/L (ref <=527)
TRIGLYCERIDES BY NMR: 94 mg/dL (ref 0–149)

## 2015-10-27 LAB — BMP8+EGFR
BUN/Creatinine Ratio: 14 (ref 10–22)
BUN: 30 mg/dL — AB (ref 8–27)
CALCIUM: 10.4 mg/dL — AB (ref 8.6–10.2)
CHLORIDE: 101 mmol/L (ref 96–106)
CO2: 26 mmol/L (ref 18–29)
Creatinine, Ser: 2.18 mg/dL — ABNORMAL HIGH (ref 0.76–1.27)
GFR calc non Af Amer: 27 mL/min/{1.73_m2} — ABNORMAL LOW (ref >59)
GFR, EST AFRICAN AMERICAN: 31 mL/min/{1.73_m2} — AB (ref >59)
Glucose: 111 mg/dL — ABNORMAL HIGH (ref 65–99)
POTASSIUM: 4.8 mmol/L (ref 3.5–5.2)
Sodium: 141 mmol/L (ref 134–144)

## 2015-10-27 LAB — CBC WITH DIFFERENTIAL/PLATELET
BASOS ABS: 0 10*3/uL (ref 0.0–0.2)
BASOS: 1 %
EOS (ABSOLUTE): 0.1 10*3/uL (ref 0.0–0.4)
Eos: 4 %
Hematocrit: 43.3 % (ref 37.5–51.0)
Hemoglobin: 14 g/dL (ref 12.6–17.7)
Immature Grans (Abs): 0 10*3/uL (ref 0.0–0.1)
Immature Granulocytes: 0 %
LYMPHS ABS: 1.2 10*3/uL (ref 0.7–3.1)
LYMPHS: 35 %
MCH: 26.6 pg (ref 26.6–33.0)
MCHC: 32.3 g/dL (ref 31.5–35.7)
MCV: 82 fL (ref 79–97)
Monocytes Absolute: 0.3 10*3/uL (ref 0.1–0.9)
Monocytes: 9 %
NEUTROS ABS: 1.8 10*3/uL (ref 1.4–7.0)
Neutrophils: 51 %
PLATELETS: 181 10*3/uL (ref 150–379)
RBC: 5.26 x10E6/uL (ref 4.14–5.80)
RDW: 14.9 % (ref 12.3–15.4)
WBC: 3.5 10*3/uL (ref 3.4–10.8)

## 2015-10-27 LAB — URIC ACID: URIC ACID: 6.8 mg/dL (ref 3.7–8.6)

## 2015-10-27 LAB — THYROID PANEL WITH TSH
FREE THYROXINE INDEX: 2.8 (ref 1.2–4.9)
T3 UPTAKE RATIO: 33 % (ref 24–39)
T4, Total: 8.4 ug/dL (ref 4.5–12.0)
TSH: 0.753 u[IU]/mL (ref 0.450–4.500)

## 2015-10-27 LAB — HEPATIC FUNCTION PANEL
ALBUMIN: 4.2 g/dL (ref 3.5–4.7)
ALK PHOS: 76 IU/L (ref 39–117)
ALT: 15 IU/L (ref 0–44)
AST: 13 IU/L (ref 0–40)
BILIRUBIN, DIRECT: 0.14 mg/dL (ref 0.00–0.40)
Bilirubin Total: 0.5 mg/dL (ref 0.0–1.2)
TOTAL PROTEIN: 7 g/dL (ref 6.0–8.5)

## 2015-10-27 LAB — VITAMIN D 25 HYDROXY (VIT D DEFICIENCY, FRACTURES): Vit D, 25-Hydroxy: 20.3 ng/mL — ABNORMAL LOW (ref 30.0–100.0)

## 2015-10-28 ENCOUNTER — Other Ambulatory Visit: Payer: Medicare Other

## 2015-10-28 DIAGNOSIS — Z1211 Encounter for screening for malignant neoplasm of colon: Secondary | ICD-10-CM | POA: Diagnosis not present

## 2015-10-29 LAB — FECAL OCCULT BLOOD, IMMUNOCHEMICAL: FECAL OCCULT BLD: POSITIVE — AB

## 2015-11-02 NOTE — Progress Notes (Signed)
Patient aware to repeat and get cbc done.

## 2015-11-04 DIAGNOSIS — M109 Gout, unspecified: Secondary | ICD-10-CM | POA: Diagnosis not present

## 2015-11-04 DIAGNOSIS — N189 Chronic kidney disease, unspecified: Secondary | ICD-10-CM | POA: Diagnosis not present

## 2015-11-05 ENCOUNTER — Other Ambulatory Visit: Payer: Medicare Other

## 2015-11-05 DIAGNOSIS — Z1211 Encounter for screening for malignant neoplasm of colon: Secondary | ICD-10-CM | POA: Diagnosis not present

## 2015-11-08 LAB — FECAL OCCULT BLOOD, IMMUNOCHEMICAL: Fecal Occult Bld: NEGATIVE

## 2015-11-10 NOTE — Progress Notes (Signed)
Patient aware.

## 2015-11-12 DIAGNOSIS — I429 Cardiomyopathy, unspecified: Secondary | ICD-10-CM | POA: Diagnosis not present

## 2015-11-12 DIAGNOSIS — I442 Atrioventricular block, complete: Secondary | ICD-10-CM | POA: Diagnosis not present

## 2015-12-17 ENCOUNTER — Other Ambulatory Visit: Payer: Self-pay | Admitting: Family Medicine

## 2016-01-06 DIAGNOSIS — N189 Chronic kidney disease, unspecified: Secondary | ICD-10-CM | POA: Diagnosis not present

## 2016-01-27 DIAGNOSIS — I442 Atrioventricular block, complete: Secondary | ICD-10-CM | POA: Diagnosis not present

## 2016-01-27 DIAGNOSIS — Z45018 Encounter for adjustment and management of other part of cardiac pacemaker: Secondary | ICD-10-CM | POA: Diagnosis not present

## 2016-03-09 ENCOUNTER — Encounter: Payer: Self-pay | Admitting: Family Medicine

## 2016-03-09 ENCOUNTER — Ambulatory Visit (INDEPENDENT_AMBULATORY_CARE_PROVIDER_SITE_OTHER): Payer: Medicare Other | Admitting: Family Medicine

## 2016-03-09 VITALS — BP 133/86 | HR 78 | Temp 96.9°F | Ht 71.5 in | Wt 174.0 lb

## 2016-03-09 DIAGNOSIS — N184 Chronic kidney disease, stage 4 (severe): Secondary | ICD-10-CM | POA: Diagnosis not present

## 2016-03-09 DIAGNOSIS — I10 Essential (primary) hypertension: Secondary | ICD-10-CM

## 2016-03-09 DIAGNOSIS — E039 Hypothyroidism, unspecified: Secondary | ICD-10-CM | POA: Diagnosis not present

## 2016-03-09 DIAGNOSIS — H6123 Impacted cerumen, bilateral: Secondary | ICD-10-CM

## 2016-03-09 DIAGNOSIS — I739 Peripheral vascular disease, unspecified: Secondary | ICD-10-CM | POA: Diagnosis not present

## 2016-03-09 DIAGNOSIS — E785 Hyperlipidemia, unspecified: Secondary | ICD-10-CM | POA: Diagnosis not present

## 2016-03-09 DIAGNOSIS — C61 Malignant neoplasm of prostate: Secondary | ICD-10-CM

## 2016-03-09 DIAGNOSIS — M1000 Idiopathic gout, unspecified site: Secondary | ICD-10-CM

## 2016-03-09 DIAGNOSIS — K429 Umbilical hernia without obstruction or gangrene: Secondary | ICD-10-CM

## 2016-03-09 DIAGNOSIS — E559 Vitamin D deficiency, unspecified: Secondary | ICD-10-CM

## 2016-03-09 NOTE — Progress Notes (Signed)
Subjective:    Patient ID: Ivan West, male    DOB: 11-Oct-1930, 82 y.o.   MRN: [id number]  HPI Pt here for follow up and management of chronic medical problems which includes hyperlipidemia and hypothyroid. He is taking medications regularly.This patient is doing well overall. He continues to be followed regularly by his cardiologist, nephrologist, and urologist. He sees most of these people about every 6 months. He is due to get lab work today. As usual he has no specific complaints. The patient has no complaints. He denies chest pain shortness of breath trouble swallowing heartburn indigestion nausea vomiting diarrhea blood in the stool or black tarry bowel movements. He is passing his water without problems. His gout is under good control. In addition to the above specialists he also sees the rheumatologist yearly.     Patient Active Problem List   Diagnosis Date Noted  . Primary gout 01/22/2015  . CKD (chronic kidney disease) 01/22/2015  . Vitamin D deficiency 01/22/2015  . Hypothyroidism 07/08/2013  . Hyperlipidemia 07/08/2013  . Elevated PSA 07/08/2013  . Prostate cancer ([place]) 07/08/2013  . CAD (coronary artery disease) 07/08/2013  . Hypertension 01/29/2013  . Elevated serum creatinine 01/29/2013  . Gout 01/29/2013   Outpatient Encounter Prescriptions as of 03/09/2016  Medication Sig  . amLODipine (NORVASC) 10 MG tablet TAKE (1) TABLET DAILY  . aspirin 81 MG tablet Take 81 mg by mouth daily.  . carvedilol (COREG) 12.5 MG tablet Take 12.5 mg by mouth 2 (two) times daily with a meal.  . febuxostat (ULORIC) 40 MG tablet Take 80 mg by mouth daily.  . fish oil-omega-3 fatty acids 1000 MG capsule Take 1 g by mouth daily.  . furosemide (LASIX) 20 MG tablet TAKE 1 TABLET AS DIRECTED AS NEEDED.  Marland Kitchen levothyroxine (SYNTHROID, LEVOTHROID) 75 MCG tablet TAKE 1 TABLET BY MOUTH DAILY   No facility-administered encounter medications on file as of 03/09/2016.       Review of Systems    Constitutional: Negative.   HENT: Negative.   Eyes: Negative.   Respiratory: Negative.   Cardiovascular: Negative.   Gastrointestinal: Negative.   Endocrine: Negative.   Genitourinary: Negative.   Musculoskeletal: Negative.   Skin: Negative.   Allergic/Immunologic: Negative.   Neurological: Negative.   Hematological: Negative.   Psychiatric/Behavioral: Negative.        Objective:   Physical Exam  Constitutional: He is oriented to person, place, and time. He appears well-developed and well-nourished. No distress.  HENT:  Head: Normocephalic and atraumatic.  Right Ear: External ear normal.  Nose: Nose normal.  Mouth/Throat: No oropharyngeal exudate.  Bilateral ear cerumen  Eyes: Conjunctivae and EOM are normal. Pupils are equal, round, and reactive to light. Right eye exhibits no discharge. Left eye exhibits no discharge. No scleral icterus.  Neck: Normal range of motion. Neck supple. No thyromegaly present.  No bruits or thyromegaly  Cardiovascular: Normal rate, regular rhythm and normal heart sounds.   No murmur heard. The pedal pulses in the right foot were difficult to palpate. The patient notes that sometimes when he is walking he may have some left thigh pain but no right leg pain. The heart has a regular rate and rhythm at 72/m  Pulmonary/Chest: Effort normal and breath sounds normal. No respiratory distress. He has no wheezes. He has no rales. He exhibits no tenderness.  No axillary adenopathy and good breath sounds bilaterally  Abdominal: Soft. Bowel sounds are normal. He exhibits no mass. There is no tenderness.  There is no rebound and no guarding.  No abdominal bruits masses or inguinal adenopathy. Good inguinal pulses bilaterally.  Genitourinary:  Genitourinary Comments: The patient is followed every 6 months by his urologist because of prostate cancer.  Musculoskeletal: Normal range of motion. He exhibits no edema.  Lymphadenopathy:    He has no cervical  adenopathy.  Neurological: He is alert and oriented to person, place, and time. He has normal reflexes. No cranial nerve deficit.  Skin: Skin is warm and dry. No rash noted.  Psychiatric: He has a normal mood and affect. His behavior is normal. Judgment and thought content normal.  Alert with a positive demeanor.  Nursing note and vitals reviewed.  BP 133/86 (BP Location: Left Arm)   Pulse 78   Temp (!) 96.9 F (36.1 C) (Oral)   Ht 5' 11.5" (1.816 m)   Wt 174 lb (78.9 kg)   BMI 23.93 kg/m         Assessment & Plan:  1. Hypothyroidism, unspecified hypothyroidism type -The patient will continue with current treatment pending results of lab work - CBC with Differential/Platelet - Thyroid Panel With TSH  2. Essential hypertension -The blood pressure is good today and he will continue with current treatment. - BMP8+EGFR - CBC with Differential/Platelet - Hepatic function panel  3. Hyperlipidemia -Continue with aggressive therapeutic lifestyle changes pending results of lab work continue with current treatment. - CBC with Differential/Platelet - NMR, lipoprofile  4. Prostate cancer [facility]) -Follow-up with urology as planned - CBC with Differential/Platelet  5. Vitamin D deficiency - CBC with Differential/Platelet - VITAMIN D 25 Hydroxy (Vit-D Deficiency, Fractures)  6. Peripheral vascular insufficiency (HCC) -Diminished pulses in right foot were noted on the exam today. The patient is not having any claudication symptoms with this leg. We will continue to watch this and he will let me know if he develops any symptoms or left his cardiologist know.  7. CKD (chronic kidney disease), stage 4 (severe) (HCC) -Continue to follow-up with nephrology  8. Idiopathic gout, unspecified chronicity, unspecified site -Continue current treatment and follow up with rheumatology as planned  9. Umbilical hernia without obstruction and without gangrene -No symptoms with this.  10.  Excessive cerumen in ear canal, bilateral -Ear irrigation bilaterally today  Patient Instructions                       Medicare Annual Wellness Visit  [place] and the medical providers at [facility] strive to bring you the best medical care.  In doing so we not only want to address your current medical conditions and concerns but also to detect new conditions early and prevent illness, disease and health-related problems.    Medicare offers a yearly Wellness Visit which allows our clinical staff to assess your need for preventative services including immunizations, lifestyle education, counseling to decrease risk of preventable diseases and screening for fall risk and other medical concerns.    This visit is provided free of charge (no copay) for all Medicare recipients. The clinical pharmacists at [facility] have begun to conduct these Wellness Visits which will also include a thorough review of all your medications.    As you primary medical provider recommend that you make an appointment for your Annual Wellness Visit if you have not done so already this year.  You may set up this appointment before you leave today or you may call back [phone]) and schedule an appointment.  Please make sure when you call that you mention that you are scheduling your Annual Wellness Visit with the clinical pharmacist so that the appointment may be made for the proper length of time.     Continue current medications. Continue good therapeutic lifestyle changes which include good diet and exercise. Fall precautions discussed with patient. If an FOBT was given today- please return it to our front desk. If you are over 38 years old - you may need Prevnar 47 or the adult Pneumonia vaccine.   After your visit with [place] today you will receive a survey in the mail or online from [employer] regarding your care with [place]. Please take a moment to fill this out. Your  feedback is very important to [place] as you can help [place] better understand your patient needs as well as improve your experience and satisfaction. WE CARE ABOUT YOU!!!   Continue to follow-up with urology cardiology and nephrology and rheumatology Stay active and avoid climbing This summer drink plenty of fluids and stay well hydrated We will call with lab results as soon as those results become available    Arrie Senate MD

## 2016-03-09 NOTE — Patient Instructions (Addendum)
Medicare Annual Wellness Visit  [facility] and the medical providers at [facility] strive to bring you the best medical care.  In doing so we not only want to address your current medical conditions and concerns but also to detect new conditions early and prevent illness, disease and health-related problems.    Medicare offers a yearly Wellness Visit which allows our clinical staff to assess your need for preventative services including immunizations, lifestyle education, counseling to decrease risk of preventable diseases and screening for fall risk and other medical concerns.    This visit is provided free of charge (no copay) for all Medicare recipients. The clinical pharmacists at [facility] have begun to conduct these Wellness Visits which will also include a thorough review of all your medications.    As you primary medical provider recommend that you make an appointment for your Annual Wellness Visit if you have not done so already this year.  You may set up this appointment before you leave today or you may call back [phone] and schedule an appointment.  Please make sure when you call that you mention that you are scheduling your Annual Wellness Visit with the clinical pharmacist so that the appointment may be made for the proper length of time.     Continue current medications. Continue good therapeutic lifestyle changes which include good diet and exercise. Fall precautions discussed with patient. If an FOBT was given today- please return it to our front desk. If you are over 27 years old - you may need Prevnar 49 or the adult Pneumonia vaccine.   After your visit with [place] today you will receive a survey in the mail or online from [employer] regarding your care with [place]. Please take a moment to fill this out. Your feedback is very important to [place] as you can help [place] better understand your patient needs as well as  improve your experience and satisfaction. WE CARE ABOUT YOU!!!   Continue to follow-up with urology cardiology and nephrology and rheumatology Stay active and avoid climbing This summer drink plenty of fluids and stay well hydrated We will call with lab results as soon as those results become available

## 2016-03-24 DIAGNOSIS — I447 Left bundle-branch block, unspecified: Secondary | ICD-10-CM | POA: Diagnosis not present

## 2016-03-24 DIAGNOSIS — I420 Dilated cardiomyopathy: Secondary | ICD-10-CM | POA: Diagnosis not present

## 2016-03-24 DIAGNOSIS — I10 Essential (primary) hypertension: Secondary | ICD-10-CM | POA: Diagnosis not present

## 2016-03-24 DIAGNOSIS — Z950 Presence of cardiac pacemaker: Secondary | ICD-10-CM | POA: Diagnosis not present

## 2016-04-25 DIAGNOSIS — Z950 Presence of cardiac pacemaker: Secondary | ICD-10-CM | POA: Diagnosis not present

## 2016-04-25 DIAGNOSIS — I10 Essential (primary) hypertension: Secondary | ICD-10-CM | POA: Diagnosis not present

## 2016-04-25 DIAGNOSIS — I447 Left bundle-branch block, unspecified: Secondary | ICD-10-CM | POA: Diagnosis not present

## 2016-04-25 DIAGNOSIS — I420 Dilated cardiomyopathy: Secondary | ICD-10-CM | POA: Diagnosis not present

## 2016-05-03 ENCOUNTER — Ambulatory Visit (INDEPENDENT_AMBULATORY_CARE_PROVIDER_SITE_OTHER): Payer: Medicare Other

## 2016-05-03 DIAGNOSIS — Z23 Encounter for immunization: Secondary | ICD-10-CM

## 2016-05-04 DIAGNOSIS — I442 Atrioventricular block, complete: Secondary | ICD-10-CM | POA: Diagnosis not present

## 2016-05-04 DIAGNOSIS — Z45018 Encounter for adjustment and management of other part of cardiac pacemaker: Secondary | ICD-10-CM | POA: Diagnosis not present

## 2016-05-09 DIAGNOSIS — I10 Essential (primary) hypertension: Secondary | ICD-10-CM | POA: Diagnosis not present

## 2016-05-09 DIAGNOSIS — N183 Chronic kidney disease, stage 3 (moderate): Secondary | ICD-10-CM | POA: Diagnosis not present

## 2016-07-19 ENCOUNTER — Ambulatory Visit (INDEPENDENT_AMBULATORY_CARE_PROVIDER_SITE_OTHER): Payer: Medicare Other | Admitting: Family Medicine

## 2016-07-19 ENCOUNTER — Encounter: Payer: Self-pay | Admitting: Family Medicine

## 2016-07-19 VITALS — BP 136/84 | HR 71 | Temp 96.8°F | Ht 71.5 in | Wt 173.0 lb

## 2016-07-19 DIAGNOSIS — I129 Hypertensive chronic kidney disease with stage 1 through stage 4 chronic kidney disease, or unspecified chronic kidney disease: Secondary | ICD-10-CM

## 2016-07-19 DIAGNOSIS — I10 Essential (primary) hypertension: Secondary | ICD-10-CM

## 2016-07-19 DIAGNOSIS — E7800 Pure hypercholesterolemia, unspecified: Secondary | ICD-10-CM | POA: Diagnosis not present

## 2016-07-19 DIAGNOSIS — M1000 Idiopathic gout, unspecified site: Secondary | ICD-10-CM

## 2016-07-19 DIAGNOSIS — E559 Vitamin D deficiency, unspecified: Secondary | ICD-10-CM

## 2016-07-19 DIAGNOSIS — C61 Malignant neoplasm of prostate: Secondary | ICD-10-CM | POA: Diagnosis not present

## 2016-07-19 DIAGNOSIS — I739 Peripheral vascular disease, unspecified: Secondary | ICD-10-CM

## 2016-07-19 DIAGNOSIS — K30 Functional dyspepsia: Secondary | ICD-10-CM

## 2016-07-19 DIAGNOSIS — N184 Chronic kidney disease, stage 4 (severe): Secondary | ICD-10-CM

## 2016-07-19 MED ORDER — FUROSEMIDE 20 MG PO TABS: ORAL_TABLET | ORAL | 11 refills | Status: AC

## 2016-07-19 NOTE — Progress Notes (Signed)
Subjective:    Patient ID: Ivan West, male    DOB: 25-Jul-1931, 82 y.o.   MRN: [id number]  HPI Pt here for follow up and management of chronic medical problems which includes hyperlipidemia and hypertension. He is taking medications regularly.This patient is always pleasant and always looks much younger than his stated age. He has a history of prostate cancer hyperlipidemia hypertension with renal insufficiency and hypothyroidism. He follows specialist regularly. He is seen by the nephrologist the rheumatologist for gout and the urologist because of the prostate cancer. He is also followed by the cardiologist. He comes today with no complaints. He is only requesting a refill on Lasix. He will get lab work today. He sees the urologist every 6-9 months. The patient denies any chest pain pressure or tightness. He denies any shortness of breath. He has no trouble with swallowing but does have occasional heartburn and indigestion. He denies any nausea vomiting diarrhea blood in the stool or black tarry bowel movements. He is passing his water without problems. He sees Dr. Shirlee More his cardiologist every 6 months and his nephrologist Dr. Wyvonnia Lora into a every 6 months. He sees the urologist, Dr. Ree Edman yearly and his rheumatologist Dr. Esperanza Sheets yearly. We talked about ranitidine or Zantac over-the-counter and he will try this on an as needed basis for his heartburn and indigestion.    Patient Active Problem List   Diagnosis Date Noted  . Primary gout 01/22/2015  . CKD (chronic kidney disease) 01/22/2015  . Vitamin D deficiency 01/22/2015  . Hypothyroidism 07/08/2013  . Hyperlipidemia 07/08/2013  . Elevated PSA 07/08/2013  . Prostate cancer ([place]) 07/08/2013  . CAD (coronary artery disease) 07/08/2013  . Hypertension 01/29/2013  . Elevated serum creatinine 01/29/2013  . Gout 01/29/2013   Outpatient Encounter Prescriptions as of 07/19/2016  Medication Sig  . amLODipine (NORVASC) 10 MG tablet  TAKE (1) TABLET DAILY  . aspirin 81 MG tablet Take 81 mg by mouth daily.  . carvedilol (COREG) 12.5 MG tablet Take 12.5 mg by mouth 2 (two) times daily with a meal.  . febuxostat (ULORIC) 40 MG tablet Take 80 mg by mouth daily.  . fish oil-omega-3 fatty acids 1000 MG capsule Take 1 g by mouth daily.  . furosemide (LASIX) 20 MG tablet TAKE 1 TABLET AS DIRECTED AS NEEDED.  Marland Kitchen levothyroxine (SYNTHROID, LEVOTHROID) 75 MCG tablet TAKE 1 TABLET BY MOUTH DAILY   No facility-administered encounter medications on file as of 07/19/2016.       Review of Systems  Constitutional: Negative.   HENT: Negative.   Eyes: Negative.   Respiratory: Negative.   Cardiovascular: Negative.   Gastrointestinal: Negative.   Endocrine: Negative.   Genitourinary: Negative.   Musculoskeletal: Negative.   Skin: Negative.   Allergic/Immunologic: Negative.   Neurological: Negative.   Hematological: Negative.   Psychiatric/Behavioral: Negative.        Objective:   Physical Exam  Constitutional: He is oriented to person, place, and time. He appears well-developed and well-nourished. No distress.  Pleasant and alert and always smiling and not complaining  HENT:  Head: Normocephalic and atraumatic.  Right Ear: External ear normal.  Left Ear: External ear normal.  Nose: Nose normal.  Mouth/Throat: Oropharynx is clear and moist. No oropharyngeal exudate.  Eyes: Conjunctivae and EOM are normal. Pupils are equal, round, and reactive to light. Right eye exhibits no discharge. Left eye exhibits no discharge. No scleral icterus.  Neck: Normal range of motion. Neck supple. No thyromegaly present.  No  bruits thyromegaly or anterior cervical adenopathy  Cardiovascular: Normal rate, regular rhythm, normal heart sounds and intact distal pulses.   No murmur heard. The heart has a regular rate and rhythm at 72/m  Pulmonary/Chest: Effort normal and breath sounds normal. No respiratory distress. He has no wheezes. He has no  rales. He exhibits no tenderness.  Clear anteriorly and posteriorly and no axillary adenopathy  Abdominal: Soft. Bowel sounds are normal. He exhibits distension. He exhibits no mass. There is no tenderness. There is no rebound and no guarding.  Some gas and distention but no sign of any organ enlargement or masses. No inguinal adenopathy and no tenderness either epigastric or suprapubic.  Genitourinary:  Genitourinary Comments: Follow-up with urology as planned  Musculoskeletal: Normal range of motion. He exhibits no edema.  Lymphadenopathy:    He has no cervical adenopathy.  Neurological: He is alert and oriented to person, place, and time. He has normal reflexes. No cranial nerve deficit.  Skin: Skin is warm and dry. No rash noted.  Psychiatric: He has a normal mood and affect. His behavior is normal. Judgment and thought content normal.  Nursing note and vitals reviewed.  BP 136/84 (BP Location: Left Arm)   Pulse 71   Temp (!) 96.8 F (36 C) (Oral)   Ht 5' 11.5" (1.816 m)   Wt 173 lb (78.5 kg)   BMI 23.79 kg/m         Assessment & Plan:  1. Essential hypertension -The blood pressure is good today but could be better. The patient will continue with current treatment and follow-up with nephrologist. - BMP8+EGFR - CBC with Differential/Platelet - Hepatic function panel  2. Pure hypercholesterolemia -Continue with aggressive therapeutic lifestyle changes and omega-3 fatty acids - CBC with Differential/Platelet - NMR, lipoprofile  3. Prostate cancer ([place]) -Continue follow-up with urology - CBC with Differential/Platelet  4. Vitamin D deficiency -No treatment of this because of his chronic kidney disease - CBC with Differential/Platelet - VITAMIN D 25 Hydroxy (Vit-D Deficiency, Fractures)  5. Acid indigestion -Take ranitidine prior to eating foods that may bother you  6. Peripheral vascular insufficiency (HCC) -Continue with walking and exercise  7. Idiopathic gout,  unspecified chronicity, unspecified site -Follow-up with rheumatology as planned and continue with current treatment  8. Benign hypertension with CKD (chronic kidney disease) stage IV ([place]) -Follow-up with nephrology as planned and avoid all NSAIDs  Meds ordered this encounter  Medications  . furosemide (LASIX) 20 MG tablet    Sig: TAKE 1 TABLET AS DIRECTED AS NEEDED.    Dispense:  30 tablet    Refill:  11   Patient Instructions                       Medicare Annual Wellness Visit  [facility] and the medical providers at [facility] strive to bring you the best medical care.  In doing so we not only want to address your current medical conditions and concerns but also to detect new conditions early and prevent illness, disease and health-related problems.    Medicare offers a yearly Wellness Visit which allows our clinical staff to assess your need for preventative services including immunizations, lifestyle education, counseling to decrease risk of preventable diseases and screening for fall risk and other medical concerns.    This visit is provided free of charge (no copay) for all Medicare recipients. The clinical pharmacists at [facility] have begun to conduct these Wellness  Visits which will also include a thorough review of all your medications.    As you primary medical provider recommend that you make an appointment for your Annual Wellness Visit if you have not done so already this year.  You may set up this appointment before you leave today or you may call back ([phone]) and schedule an appointment.  Please make sure when you call that you mention that you are scheduling your Annual Wellness Visit with the clinical pharmacist so that the appointment may be made for the proper length of time.     Continue current medications. Continue good therapeutic lifestyle changes which include good diet and exercise. Fall precautions  discussed with patient. If an FOBT was given today- please return it to our front desk. If you are over 66 years old - you may need Prevnar 66 or the adult Pneumonia vaccine.  **Flu shots are available--- please call and schedule a FLU-CLINIC appointment**  After your visit with [place] today you will receive a survey in the mail or online from [employer] regarding your care with [place]. Please take a moment to fill this out. Your feedback is very important to [place] as you can help [place] better understand your patient needs as well as improve your experience and satisfaction. WE CARE ABOUT YOU!!!   Continue to follow-up with rheumatology nephrology cardiology and urology You can try ranitidine or Zantac 150 mg, the equate brand and this can be purchased at [facility] at a very low price. You can take this on an as needed basis for heartburn and indigestion or if you know the you're going to eat something that may cause the heartburn and indigestion take it before you eat that.  Arrie Senate MD

## 2016-07-19 NOTE — Patient Instructions (Addendum)
Medicare Annual Wellness Visit  [facility] and the medical providers at [facility] strive to bring you the best medical care.  In doing so we not only want to address your current medical conditions and concerns but also to detect new conditions early and prevent illness, disease and health-related problems.    Medicare offers a yearly Wellness Visit which allows our clinical staff to assess your need for preventative services including immunizations, lifestyle education, counseling to decrease risk of preventable diseases and screening for fall risk and other medical concerns.    This visit is provided free of charge (no copay) for all Medicare recipients. The clinical pharmacists at [facility] have begun to conduct these Wellness Visits which will also include a thorough review of all your medications.    As you primary medical provider recommend that you make an appointment for your Annual Wellness Visit if you have not done so already this year.  You may set up this appointment before you leave today or you may call back ([phone]) and schedule an appointment.  Please make sure when you call that you mention that you are scheduling your Annual Wellness Visit with the clinical pharmacist so that the appointment may be made for the proper length of time.     Continue current medications. Continue good therapeutic lifestyle changes which include good diet and exercise. Fall precautions discussed with patient. If an FOBT was given today- please return it to our front desk. If you are over 8 years old - you may need Prevnar 56 or the adult Pneumonia vaccine.  **Flu shots are available--- please call and schedule a FLU-CLINIC appointment**  After your visit with [place] today you will receive a survey in the mail or online from [employer] regarding your care with [place]. Please take a moment to fill this out. Your feedback is very  important to [place] as you can help [place] better understand your patient needs as well as improve your experience and satisfaction. WE CARE ABOUT YOU!!!   Continue to follow-up with rheumatology nephrology cardiology and urology You can try ranitidine or Zantac 150 mg, the equate brand and this can be purchased at [facility] at a very low price. You can take this on an as needed basis for heartburn and indigestion or if you know the you're going to eat something that may cause the heartburn and indigestion take it before you eat that.

## 2016-07-20 LAB — CBC WITH DIFFERENTIAL/PLATELET
Basophils Absolute: 0 10*3/uL (ref 0.0–0.2)
Basos: 1 %
EOS (ABSOLUTE): 0.1 10*3/uL (ref 0.0–0.4)
Eos: 4 %
Hematocrit: 42.4 % (ref 37.5–51.0)
Hemoglobin: 13.8 g/dL (ref 13.0–17.7)
Immature Grans (Abs): 0 10*3/uL (ref 0.0–0.1)
Immature Granulocytes: 0 %
LYMPHS ABS: 1.3 10*3/uL (ref 0.7–3.1)
Lymphs: 38 %
MCH: 26.9 pg (ref 26.6–33.0)
MCHC: 32.5 g/dL (ref 31.5–35.7)
MCV: 83 fL (ref 79–97)
MONOS ABS: 0.4 10*3/uL (ref 0.1–0.9)
Monocytes: 13 %
NEUTROS ABS: 1.5 10*3/uL (ref 1.4–7.0)
Neutrophils: 44 %
Platelets: 198 10*3/uL (ref 150–379)
RBC: 5.13 x10E6/uL (ref 4.14–5.80)
RDW: 14.9 % (ref 12.3–15.4)
WBC: 3.4 10*3/uL (ref 3.4–10.8)

## 2016-07-20 LAB — HEPATIC FUNCTION PANEL
ALT: 13 IU/L (ref 0–44)
AST: 18 IU/L (ref 0–40)
Albumin: 4.2 g/dL (ref 3.5–4.7)
Alkaline Phosphatase: 82 IU/L (ref 39–117)
BILIRUBIN TOTAL: 0.5 mg/dL (ref 0.0–1.2)
BILIRUBIN, DIRECT: 0.13 mg/dL (ref 0.00–0.40)
TOTAL PROTEIN: 7.3 g/dL (ref 6.0–8.5)

## 2016-07-20 LAB — BMP8+EGFR
BUN/Creatinine Ratio: 13 (ref 10–24)
BUN: 29 mg/dL — AB (ref 8–27)
CALCIUM: 10.8 mg/dL — AB (ref 8.6–10.2)
CHLORIDE: 103 mmol/L (ref 96–106)
CO2: 24 mmol/L (ref 18–29)
CREATININE: 2.23 mg/dL — AB (ref 0.76–1.27)
GFR calc Af Amer: 30 mL/min/{1.73_m2} — ABNORMAL LOW (ref >59)
GFR, EST NON AFRICAN AMERICAN: 26 mL/min/{1.73_m2} — AB (ref >59)
Glucose: 92 mg/dL (ref 65–99)
Potassium: 5.3 mmol/L — ABNORMAL HIGH (ref 3.5–5.2)
Sodium: 141 mmol/L (ref 134–144)

## 2016-07-20 LAB — NMR, LIPOPROFILE
Cholesterol: 155 mg/dL (ref 100–199)
HDL CHOLESTEROL BY NMR: 49 mg/dL (ref >39)
HDL PARTICLE NUMBER: 28.8 umol/L — AB (ref >=30.5)
LDL Particle Number: 874 nmol/L (ref <1000)
LDL Size: 20.8 nm (ref >20.5)
LDL-C: 86 mg/dL (ref 0–99)
LP-IR Score: 31 (ref <=45)
Small LDL Particle Number: 411 nmol/L (ref <=527)
TRIGLYCERIDES BY NMR: 99 mg/dL (ref 0–149)

## 2016-07-20 LAB — VITAMIN D 25 HYDROXY (VIT D DEFICIENCY, FRACTURES): VIT D 25 HYDROXY: 15.2 ng/mL — AB (ref 30.0–100.0)

## 2016-07-25 DIAGNOSIS — I10 Essential (primary) hypertension: Secondary | ICD-10-CM | POA: Diagnosis not present

## 2016-09-21 DIAGNOSIS — Z45018 Encounter for adjustment and management of other part of cardiac pacemaker: Secondary | ICD-10-CM | POA: Diagnosis not present

## 2016-09-23 DIAGNOSIS — M109 Gout, unspecified: Secondary | ICD-10-CM | POA: Diagnosis not present

## 2016-09-23 DIAGNOSIS — R0789 Other chest pain: Secondary | ICD-10-CM | POA: Diagnosis not present

## 2016-09-23 DIAGNOSIS — I429 Cardiomyopathy, unspecified: Secondary | ICD-10-CM | POA: Diagnosis not present

## 2016-09-23 DIAGNOSIS — N183 Chronic kidney disease, stage 3 (moderate): Secondary | ICD-10-CM | POA: Diagnosis not present

## 2016-09-23 DIAGNOSIS — I5022 Chronic systolic (congestive) heart failure: Secondary | ICD-10-CM | POA: Diagnosis not present

## 2016-09-23 DIAGNOSIS — R9431 Abnormal electrocardiogram [ECG] [EKG]: Secondary | ICD-10-CM | POA: Diagnosis not present

## 2016-09-23 DIAGNOSIS — E039 Hypothyroidism, unspecified: Secondary | ICD-10-CM | POA: Diagnosis not present

## 2016-09-23 DIAGNOSIS — R0602 Shortness of breath: Secondary | ICD-10-CM | POA: Diagnosis not present

## 2016-09-23 DIAGNOSIS — I130 Hypertensive heart and chronic kidney disease with heart failure and stage 1 through stage 4 chronic kidney disease, or unspecified chronic kidney disease: Secondary | ICD-10-CM | POA: Diagnosis not present

## 2016-09-23 DIAGNOSIS — I082 Rheumatic disorders of both aortic and tricuspid valves: Secondary | ICD-10-CM | POA: Diagnosis not present

## 2016-09-23 DIAGNOSIS — I442 Atrioventricular block, complete: Secondary | ICD-10-CM | POA: Diagnosis not present

## 2016-09-23 DIAGNOSIS — R079 Chest pain, unspecified: Secondary | ICD-10-CM | POA: Diagnosis not present

## 2016-09-23 DIAGNOSIS — I447 Left bundle-branch block, unspecified: Secondary | ICD-10-CM | POA: Diagnosis not present

## 2016-09-23 DIAGNOSIS — I420 Dilated cardiomyopathy: Secondary | ICD-10-CM | POA: Diagnosis not present

## 2016-09-24 DIAGNOSIS — I083 Combined rheumatic disorders of mitral, aortic and tricuspid valves: Secondary | ICD-10-CM | POA: Diagnosis not present

## 2016-09-24 DIAGNOSIS — R079 Chest pain, unspecified: Secondary | ICD-10-CM | POA: Diagnosis not present

## 2016-09-24 DIAGNOSIS — I519 Heart disease, unspecified: Secondary | ICD-10-CM | POA: Diagnosis not present

## 2016-09-24 DIAGNOSIS — I517 Cardiomegaly: Secondary | ICD-10-CM | POA: Diagnosis not present

## 2016-09-24 DIAGNOSIS — Z950 Presence of cardiac pacemaker: Secondary | ICD-10-CM | POA: Diagnosis not present

## 2016-10-10 DIAGNOSIS — I447 Left bundle-branch block, unspecified: Secondary | ICD-10-CM | POA: Diagnosis not present

## 2016-10-10 DIAGNOSIS — I5022 Chronic systolic (congestive) heart failure: Secondary | ICD-10-CM | POA: Diagnosis not present

## 2016-10-10 DIAGNOSIS — I420 Dilated cardiomyopathy: Secondary | ICD-10-CM | POA: Diagnosis not present

## 2016-10-10 DIAGNOSIS — R079 Chest pain, unspecified: Secondary | ICD-10-CM | POA: Diagnosis not present

## 2016-10-17 ENCOUNTER — Other Ambulatory Visit: Payer: Self-pay | Admitting: Family Medicine

## 2016-10-18 DIAGNOSIS — I10 Essential (primary) hypertension: Secondary | ICD-10-CM | POA: Diagnosis not present

## 2016-10-18 DIAGNOSIS — Z950 Presence of cardiac pacemaker: Secondary | ICD-10-CM | POA: Diagnosis not present

## 2016-10-18 DIAGNOSIS — I420 Dilated cardiomyopathy: Secondary | ICD-10-CM | POA: Diagnosis not present

## 2016-11-23 DIAGNOSIS — I10 Essential (primary) hypertension: Secondary | ICD-10-CM | POA: Diagnosis not present

## 2016-11-23 DIAGNOSIS — N3946 Mixed incontinence: Secondary | ICD-10-CM | POA: Diagnosis not present

## 2016-11-23 DIAGNOSIS — Z8546 Personal history of malignant neoplasm of prostate: Secondary | ICD-10-CM | POA: Diagnosis not present

## 2016-12-01 ENCOUNTER — Encounter: Payer: Self-pay | Admitting: Family Medicine

## 2016-12-01 ENCOUNTER — Ambulatory Visit (INDEPENDENT_AMBULATORY_CARE_PROVIDER_SITE_OTHER): Payer: Medicare Other | Admitting: Family Medicine

## 2016-12-01 VITALS — BP 113/68 | HR 64 | Temp 97.0°F | Ht 71.5 in | Wt 174.0 lb

## 2016-12-01 DIAGNOSIS — E7800 Pure hypercholesterolemia, unspecified: Secondary | ICD-10-CM

## 2016-12-01 DIAGNOSIS — K429 Umbilical hernia without obstruction or gangrene: Secondary | ICD-10-CM

## 2016-12-01 DIAGNOSIS — I10 Essential (primary) hypertension: Secondary | ICD-10-CM

## 2016-12-01 DIAGNOSIS — C61 Malignant neoplasm of prostate: Secondary | ICD-10-CM | POA: Diagnosis not present

## 2016-12-01 DIAGNOSIS — N183 Chronic kidney disease, stage 3 (moderate): Secondary | ICD-10-CM | POA: Diagnosis not present

## 2016-12-01 DIAGNOSIS — E559 Vitamin D deficiency, unspecified: Secondary | ICD-10-CM | POA: Diagnosis not present

## 2016-12-01 DIAGNOSIS — I739 Peripheral vascular disease, unspecified: Secondary | ICD-10-CM

## 2016-12-01 DIAGNOSIS — N1832 Chronic kidney disease, stage 3b: Secondary | ICD-10-CM

## 2016-12-01 DIAGNOSIS — E039 Hypothyroidism, unspecified: Secondary | ICD-10-CM

## 2016-12-01 NOTE — Patient Instructions (Addendum)
Medicare Annual Wellness Visit  [facility] and the medical providers at [facility] strive to bring you the best medical care.  In doing so we not only want to address your current medical conditions and concerns but also to detect new conditions early and prevent illness, disease and health-related problems.    Medicare offers a yearly Wellness Visit which allows our clinical staff to assess your need for preventative services including immunizations, lifestyle education, counseling to decrease risk of preventable diseases and screening for fall risk and other medical concerns.    This visit is provided free of charge (no copay) for all Medicare recipients. The clinical pharmacists at [facility] have begun to conduct these Wellness Visits which will also include a thorough review of all your medications.    As you primary medical provider recommend that you make an appointment for your Annual Wellness Visit if you have not done so already this year.  You may set up this appointment before you leave today or you may call back ([phone]) and schedule an appointment.  Please make sure when you call that you mention that you are scheduling your Annual Wellness Visit with the clinical pharmacist so that the appointment may be made for the proper length of time.     Continue current medications. Continue good therapeutic lifestyle changes which include good diet and exercise. Fall precautions discussed with patient. If an FOBT was given today- please return it to our front desk. If you are over 43 years old - you may need Prevnar 65 or the adult Pneumonia vaccine.  **Flu shots are available--- please call and schedule a FLU-CLINIC appointment**  After your visit with [place] today you will receive a survey in the mail or online from [employer] regarding your care with [place]. Please take a moment to fill this out. Your feedback is very  important to [place] as you can help [place] better understand your patient needs as well as improve your experience and satisfaction. WE CARE ABOUT YOU!!!   The patient should continue to follow-up with his cardiologist, his urologist, his nephrologist, and his rheumatologist.

## 2016-12-01 NOTE — Progress Notes (Signed)
Subjective:    Patient ID: Ivan West, male    DOB: 07-16-1931, 83 y.o.   MRN: [id number]  HPI Pt here for follow up and management of chronic medical problems which includes hyperlipidemia and hypertension. He is taking medication regularly.The patient comes to the visit today with no complaints. He is due to return an FOBT and will get lab work today. This patient has a history of multiple medical problems including chronic kidney disease elevated PSA and serum creatinine gout hyperlipidemia and prostate cancer. He also has vitamin D deficiency. He sees several specialists including a nephrologist, a urologist, and a cardiologist. His initial vital signs were good today. The patient does follow up regularly with his specialists. He sees the urologist because of his history of prostate cancer. He sees the nephrologist because of his renal insufficiency. He sees the cardiologist regularly every 6 months. He did have an episode in February of severe chest pain ended up in the hospital and spent one night and no cardiac reasons were found for this. Since that time he has been stable with no further chest pain or shortness of breath. He denies any trouble with his stomach including nausea vomiting diarrhea or blood in the stool. He says he's passing his water well. His gout has been under good control.     Patient Active Problem List   Diagnosis Date Noted  . Primary gout 01/22/2015  . CKD (chronic kidney disease) 01/22/2015  . Vitamin D deficiency 01/22/2015  . Hypothyroidism 07/08/2013  . Hyperlipidemia 07/08/2013  . Elevated PSA 07/08/2013  . Prostate cancer ([place]) 07/08/2013  . CAD (coronary artery disease) 07/08/2013  . Benign hypertension with CKD (chronic kidney disease) stage IV ([place]) 01/29/2013  . Elevated serum creatinine 01/29/2013  . Gout 01/29/2013   Outpatient Encounter Prescriptions as of 12/01/2016  Medication Sig  . amLODipine (NORVASC) 10 MG tablet TAKE (1) TABLET DAILY    . aspirin 81 MG tablet Take 81 mg by mouth daily.  . carvedilol (COREG) 12.5 MG tablet Take 12.5 mg by mouth 2 (two) times daily with a meal.  . febuxostat (ULORIC) 40 MG tablet Take 80 mg by mouth daily.  . fish oil-omega-3 fatty acids 1000 MG capsule Take 1 g by mouth daily.  . furosemide (LASIX) 20 MG tablet TAKE 1 TABLET AS DIRECTED AS NEEDED.  Marland Kitchen isosorbide mononitrate (IMDUR) 30 MG 24 hr tablet Take 1 tablet by mouth at bedtime.  Marland Kitchen levothyroxine (SYNTHROID, LEVOTHROID) 75 MCG tablet TAKE 1 TABLET BY MOUTH DAILY   No facility-administered encounter medications on file as of 12/01/2016.      Review of Systems  Constitutional: Negative.   HENT: Negative.   Eyes: Negative.   Respiratory: Negative.   Cardiovascular: Negative.   Gastrointestinal: Negative.   Endocrine: Negative.   Genitourinary: Negative.   Musculoskeletal: Negative.   Skin: Negative.   Allergic/Immunologic: Negative.   Neurological: Negative.   Hematological: Negative.   Psychiatric/Behavioral: Negative.        Objective:   Physical Exam  Constitutional: He is oriented to person, place, and time. He appears well-developed and well-nourished. No distress.  The patient is pleasant and alert and looks much younger than his stated age of 12 years.  HENT:  Head: Normocephalic and atraumatic.  Right Ear: External ear normal.  Left Ear: External ear normal.  Nose: Nose normal.  Mouth/Throat: Oropharynx is clear and moist. No oropharyngeal exudate.  Minimal earwax bilaterally  Eyes: Conjunctivae and EOM are normal. Pupils  are equal, round, and reactive to light. Right eye exhibits no discharge. Left eye exhibits no discharge. No scleral icterus.  Neck: Normal range of motion. Neck supple. No thyromegaly present.  No bruits thyromegaly or anterior cervical adenopathy  Cardiovascular: Normal rate, regular rhythm, normal heart sounds and intact distal pulses.   No murmur heard. Distal pulses were palpable but weak.  The heart is regular at 72/m  Pulmonary/Chest: Effort normal and breath sounds normal. No respiratory distress. He has no wheezes. He has no rales. He exhibits no tenderness.  No axillary adenopathy and lungs were clear anteriorly and posteriorly  Abdominal: Soft. Bowel sounds are normal. He exhibits no mass. There is no tenderness. There is no rebound and no guarding.  The patient still continues to have an umbilical hernia but no complaints with this. There is no liver or spleen enlargement no masses and no bruits apparent.  Genitourinary:  Genitourinary Comments: The patient sees the urologist every 6 months because of prostate cancer.  Musculoskeletal: Normal range of motion. He exhibits no edema.  Lymphadenopathy:    He has no cervical adenopathy.  Neurological: He is alert and oriented to person, place, and time. He has normal reflexes. No cranial nerve deficit.  Skin: Skin is warm and dry. No rash noted.  Psychiatric: He has a normal mood and affect. His behavior is normal. Judgment and thought content normal.  Nursing note and vitals reviewed.  BP 113/68 (BP Location: Left Arm)   Pulse 64   Temp 97 F (36.1 C) (Oral)   Ht 5' 11.5" (1.816 m)   Wt 174 lb (78.9 kg)   BMI 23.93 kg/m         Assessment & Plan:  1. Essential hypertension -The blood pressure is good and he will continue with current treatment - BMP8+EGFR - CBC with Differential/Platelet - Hepatic function panel  2. Pure hypercholesterolemia -Continue with aggressive therapeutic lifestyle changes and omega-3 fatty acids. - CBC with Differential/Platelet - NMR, lipoprofile  3. Prostate cancer ([place]) -[place] to follow-up with urology - CBC with Differential/Platelet  4. Vitamin D deficiency -Continue with vitamin D replacement pending results of lab work and visit to nephrologist - CBC with Differential/Platelet - VITAMIN D 25 Hydroxy (Vit-D Deficiency, Fractures)  5. Hypothyroidism, unspecified  type -Continue with current treatment pending results of lab work - Thyroid Panel With TSH - CBC with Differential/Platelet  6. Peripheral vascular insufficiency (HCC) -Walk and exercise regularly  7. Umbilical hernia without obstruction and without gangrene -He has no complaints with this persistent umbilical hernia and it does not appear any larger since this visit.  8. Chronic kidney disease (CKD) stage G3b/A1, moderately decreased glomerular filtration rate (GFR) between 30-44 mL/min/1.73 square meter and albuminuria creatinine ratio less than 30 mg/g -Continue to follow-up with nephrology  Patient Instructions                       Medicare Annual Wellness Visit  [facility] and the medical providers at [facility] strive to bring you the best medical care.  In doing so we not only want to address your current medical conditions and concerns but also to detect new conditions early and prevent illness, disease and health-related problems.    Medicare offers a yearly Wellness Visit which allows our clinical staff to assess your need for preventative services including immunizations, lifestyle education, counseling to decrease risk of preventable diseases and screening for fall risk and other medical concerns.  This visit is provided free of charge (no copay) for all Medicare recipients. The clinical pharmacists at [facility] have begun to conduct these Wellness Visits which will also include a thorough review of all your medications.    As you primary medical provider recommend that you make an appointment for your Annual Wellness Visit if you have not done so already this year.  You may set up this appointment before you leave today or you may call back ([phone]) and schedule an appointment.  Please make sure when you call that you mention that you are scheduling your Annual Wellness Visit with the clinical pharmacist so that the  appointment may be made for the proper length of time.     Continue current medications. Continue good therapeutic lifestyle changes which include good diet and exercise. Fall precautions discussed with patient. If an FOBT was given today- please return it to our front desk. If you are over 91 years old - you may need Prevnar 58 or the adult Pneumonia vaccine.  **Flu shots are available--- please call and schedule a FLU-CLINIC appointment**  After your visit with [place] today you will receive a survey in the mail or online from [employer] regarding your care with [place]. Please take a moment to fill this out. Your feedback is very important to [place] as you can help [place] better understand your patient needs as well as improve your experience and satisfaction. WE CARE ABOUT YOU!!!   The patient should continue to follow-up with his cardiologist, his urologist, his nephrologist, and his rheumatologist.    Arrie Senate MD

## 2016-12-02 LAB — BMP8+EGFR
BUN/Creatinine Ratio: 11 (ref 10–24)
BUN: 28 mg/dL — AB (ref 8–27)
CHLORIDE: 101 mmol/L (ref 96–106)
CO2: 21 mmol/L (ref 18–29)
Calcium: 10.3 mg/dL — ABNORMAL HIGH (ref 8.6–10.2)
Creatinine, Ser: 2.46 mg/dL — ABNORMAL HIGH (ref 0.76–1.27)
GFR calc Af Amer: 27 mL/min/{1.73_m2} — ABNORMAL LOW (ref >59)
GFR calc non Af Amer: 23 mL/min/{1.73_m2} — ABNORMAL LOW (ref >59)
GLUCOSE: 104 mg/dL — AB (ref 65–99)
POTASSIUM: 4.9 mmol/L (ref 3.5–5.2)
SODIUM: 140 mmol/L (ref 134–144)

## 2016-12-02 LAB — THYROID PANEL WITH TSH
FREE THYROXINE INDEX: 2.4 (ref 1.2–4.9)
T3 UPTAKE RATIO: 33 % (ref 24–39)
T4, Total: 7.4 ug/dL (ref 4.5–12.0)
TSH: 0.657 u[IU]/mL (ref 0.450–4.500)

## 2016-12-02 LAB — CBC WITH DIFFERENTIAL/PLATELET
BASOS ABS: 0 10*3/uL (ref 0.0–0.2)
BASOS: 1 %
EOS (ABSOLUTE): 0.1 10*3/uL (ref 0.0–0.4)
Eos: 4 %
Hematocrit: 40.9 % (ref 37.5–51.0)
Hemoglobin: 13.2 g/dL (ref 13.0–17.7)
IMMATURE GRANS (ABS): 0 10*3/uL (ref 0.0–0.1)
IMMATURE GRANULOCYTES: 0 %
Lymphocytes Absolute: 1.4 10*3/uL (ref 0.7–3.1)
Lymphs: 39 %
MCH: 26.5 pg — ABNORMAL LOW (ref 26.6–33.0)
MCHC: 32.3 g/dL (ref 31.5–35.7)
MCV: 82 fL (ref 79–97)
MONOS ABS: 0.5 10*3/uL (ref 0.1–0.9)
Monocytes: 14 %
NEUTROS PCT: 42 %
Neutrophils Absolute: 1.5 10*3/uL (ref 1.4–7.0)
PLATELETS: 171 10*3/uL (ref 150–379)
RBC: 4.98 x10E6/uL (ref 4.14–5.80)
RDW: 14.3 % (ref 12.3–15.4)
WBC: 3.5 10*3/uL (ref 3.4–10.8)

## 2016-12-02 LAB — NMR, LIPOPROFILE
Cholesterol: 152 mg/dL (ref 100–199)
HDL Cholesterol by NMR: 47 mg/dL (ref >39)
HDL PARTICLE NUMBER: 26.9 umol/L — AB (ref >=30.5)
LDL PARTICLE NUMBER: 793 nmol/L (ref <1000)
LDL SIZE: 20.9 nm (ref >20.5)
LDL-C: 85 mg/dL (ref 0–99)
LP-IR SCORE: 25 (ref <=45)
SMALL LDL PARTICLE NUMBER: 204 nmol/L (ref <=527)
Triglycerides by NMR: 100 mg/dL (ref 0–149)

## 2016-12-02 LAB — HEPATIC FUNCTION PANEL
ALT: 11 IU/L (ref 0–44)
AST: 12 IU/L (ref 0–40)
Albumin: 4.2 g/dL (ref 3.5–4.7)
Alkaline Phosphatase: 83 IU/L (ref 39–117)
BILIRUBIN TOTAL: 0.5 mg/dL (ref 0.0–1.2)
BILIRUBIN, DIRECT: 0.15 mg/dL (ref 0.00–0.40)
TOTAL PROTEIN: 7 g/dL (ref 6.0–8.5)

## 2016-12-02 LAB — VITAMIN D 25 HYDROXY (VIT D DEFICIENCY, FRACTURES): Vit D, 25-Hydroxy: 16.9 ng/mL — ABNORMAL LOW (ref 30.0–100.0)

## 2016-12-05 ENCOUNTER — Other Ambulatory Visit: Payer: Medicare Other

## 2016-12-05 DIAGNOSIS — Z1211 Encounter for screening for malignant neoplasm of colon: Secondary | ICD-10-CM

## 2016-12-07 LAB — FECAL OCCULT BLOOD, IMMUNOCHEMICAL: Fecal Occult Bld: NEGATIVE

## 2016-12-15 DIAGNOSIS — N183 Chronic kidney disease, stage 3 (moderate): Secondary | ICD-10-CM | POA: Diagnosis not present

## 2016-12-15 DIAGNOSIS — I10 Essential (primary) hypertension: Secondary | ICD-10-CM | POA: Diagnosis not present

## 2016-12-22 DIAGNOSIS — Z45018 Encounter for adjustment and management of other part of cardiac pacemaker: Secondary | ICD-10-CM | POA: Diagnosis not present

## 2016-12-22 DIAGNOSIS — I442 Atrioventricular block, complete: Secondary | ICD-10-CM | POA: Diagnosis not present

## 2017-01-18 DIAGNOSIS — I10 Essential (primary) hypertension: Secondary | ICD-10-CM | POA: Diagnosis not present

## 2017-01-18 DIAGNOSIS — I420 Dilated cardiomyopathy: Secondary | ICD-10-CM | POA: Diagnosis not present

## 2017-01-18 DIAGNOSIS — I5022 Chronic systolic (congestive) heart failure: Secondary | ICD-10-CM | POA: Diagnosis not present

## 2017-01-24 DIAGNOSIS — I420 Dilated cardiomyopathy: Secondary | ICD-10-CM | POA: Diagnosis not present

## 2017-01-24 DIAGNOSIS — I5022 Chronic systolic (congestive) heart failure: Secondary | ICD-10-CM | POA: Diagnosis not present

## 2017-02-14 ENCOUNTER — Other Ambulatory Visit: Payer: Self-pay | Admitting: Family Medicine

## 2017-03-30 DIAGNOSIS — I442 Atrioventricular block, complete: Secondary | ICD-10-CM | POA: Diagnosis not present

## 2017-03-30 DIAGNOSIS — Z45018 Encounter for adjustment and management of other part of cardiac pacemaker: Secondary | ICD-10-CM | POA: Diagnosis not present

## 2017-04-13 DIAGNOSIS — N184 Chronic kidney disease, stage 4 (severe): Secondary | ICD-10-CM | POA: Diagnosis not present

## 2017-04-13 DIAGNOSIS — D631 Anemia in chronic kidney disease: Secondary | ICD-10-CM | POA: Diagnosis not present

## 2017-04-13 DIAGNOSIS — I10 Essential (primary) hypertension: Secondary | ICD-10-CM | POA: Diagnosis not present

## 2017-04-13 DIAGNOSIS — N189 Chronic kidney disease, unspecified: Secondary | ICD-10-CM | POA: Diagnosis not present

## 2017-04-19 ENCOUNTER — Ambulatory Visit: Payer: Medicare Other | Admitting: Family Medicine

## 2017-05-04 ENCOUNTER — Encounter: Payer: Self-pay | Admitting: Family Medicine

## 2017-05-04 ENCOUNTER — Ambulatory Visit (INDEPENDENT_AMBULATORY_CARE_PROVIDER_SITE_OTHER): Payer: Medicare Other | Admitting: Family Medicine

## 2017-05-04 VITALS — BP 128/70 | HR 68 | Temp 97.2°F | Ht 71.5 in | Wt 170.0 lb

## 2017-05-04 DIAGNOSIS — I10 Essential (primary) hypertension: Secondary | ICD-10-CM | POA: Diagnosis not present

## 2017-05-04 DIAGNOSIS — M1000 Idiopathic gout, unspecified site: Secondary | ICD-10-CM | POA: Diagnosis not present

## 2017-05-04 DIAGNOSIS — K429 Umbilical hernia without obstruction or gangrene: Secondary | ICD-10-CM

## 2017-05-04 DIAGNOSIS — H6122 Impacted cerumen, left ear: Secondary | ICD-10-CM | POA: Diagnosis not present

## 2017-05-04 DIAGNOSIS — N183 Chronic kidney disease, stage 3 (moderate): Secondary | ICD-10-CM

## 2017-05-04 DIAGNOSIS — E039 Hypothyroidism, unspecified: Secondary | ICD-10-CM

## 2017-05-04 DIAGNOSIS — E559 Vitamin D deficiency, unspecified: Secondary | ICD-10-CM

## 2017-05-04 DIAGNOSIS — E7800 Pure hypercholesterolemia, unspecified: Secondary | ICD-10-CM | POA: Diagnosis not present

## 2017-05-04 DIAGNOSIS — N1832 Chronic kidney disease, stage 3b: Secondary | ICD-10-CM

## 2017-05-04 DIAGNOSIS — Z23 Encounter for immunization: Secondary | ICD-10-CM

## 2017-05-04 DIAGNOSIS — C61 Malignant neoplasm of prostate: Secondary | ICD-10-CM

## 2017-05-04 NOTE — Patient Instructions (Addendum)
Medicare Annual Wellness Visit  [facility] and the medical providers at [facility] strive to bring you the best medical care.  In doing so we not only want to address your current medical conditions and concerns but also to detect new conditions early and prevent illness, disease and health-related problems.    Medicare offers a yearly Wellness Visit which allows our clinical staff to assess your need for preventative services including immunizations, lifestyle education, counseling to decrease risk of preventable diseases and screening for fall risk and other medical concerns.    This visit is provided free of charge (no copay) for all Medicare recipients. The clinical pharmacists at [facility] have begun to conduct these Wellness Visits which will also include a thorough review of all your medications.    As you primary medical provider recommend that you make an appointment for your Annual Wellness Visit if you have not done so already this year.  You may set up this appointment before you leave today or you may call back ([phone]) and schedule an appointment.  Please make sure when you call that you mention that you are scheduling your Annual Wellness Visit with the clinical pharmacist so that the appointment may be made for the proper length of time.     Continue current medications. Continue good therapeutic lifestyle changes which include good diet and exercise. Fall precautions discussed with patient. If an FOBT was given today- please return it to our front desk. If you are over 68 years old - you may need Prevnar 62 or the adult Pneumonia vaccine.  **Flu shots are available--- please call and schedule a FLU-CLINIC appointment**  After your visit with [place] today you will receive a survey in the mail or online from [employer] regarding your care with [place]. Please take a moment to fill this out. Your feedback is very  important to [place] as you can help [place] better understand your patient needs as well as improve your experience and satisfaction. WE CARE ABOUT YOU!!!   The patient will get his flu shot today Continue to follow-up with the cardiologist and the nephrologist every 4 months as you are currently doing and the urologist and the rheumatologist yearly as you're doing are as they recommend Stay active physically and drink plenty of fluids

## 2017-05-04 NOTE — Progress Notes (Signed)
Subjective:    Patient ID: Ivan West, male    DOB: [birth year], 83 y.o.   MRN: [id number]  HPI Pt here for follow up and management of chronic medical problems which includes hypertension and hyperlipidemia. He is taking medication regularly.Mr. Ruhland is pleasant and doing well. He is followed regularly by several physicians including the nephrologist the urologist and the cardiologist and the rheumatologist. He is not a complainer and as usual has no complaints today. He will get lab work today and the flu shot today. The urologist does his PSA and prostate exams. The patient continues to do extremely well and following all his specialists. He sees the cardiologist every 4 months and the nephrologist every 4 months he sees the urologist and the rheumatologist once a year. He denies any chest pain or shortness of breath. He denies any trouble with his intestinal tract including nausea vomiting diarrhea blood in the stool or black tarry bowel movements. He is having no problems with passing his water although he does wear a depends. He is very positive and stays very active.    Patient Active Problem List   Diagnosis Date Noted  . Primary gout 01/22/2015  . CKD (chronic kidney disease) 01/22/2015  . Vitamin D deficiency 01/22/2015  . Hypothyroidism 07/08/2013  . Hyperlipidemia 07/08/2013  . Elevated PSA 07/08/2013  . Prostate cancer ([place]) 07/08/2013  . CAD (coronary artery disease) 07/08/2013  . Benign hypertension with CKD (chronic kidney disease) stage IV ([place]) 01/29/2013  . Elevated serum creatinine 01/29/2013  . Gout 01/29/2013   Outpatient Encounter Prescriptions as of 05/04/2017  Medication Sig  . amLODipine (NORVASC) 10 MG tablet TAKE (1) TABLET DAILY  . aspirin 81 MG tablet Take 81 mg by mouth daily.  . carvedilol (COREG) 12.5 MG tablet Take 12.5 mg by mouth 2 (two) times daily with a meal.  . febuxostat (ULORIC) 40 MG tablet Take 80 mg by mouth daily.  . fish oil-omega-3  fatty acids 1000 MG capsule Take 1 g by mouth daily.  . furosemide (LASIX) 20 MG tablet TAKE 1 TABLET AS DIRECTED AS NEEDED.  Marland Kitchen isosorbide mononitrate (IMDUR) 30 MG 24 hr tablet Take 1 tablet by mouth at bedtime.  Marland Kitchen levothyroxine (SYNTHROID, LEVOTHROID) 75 MCG tablet TAKE 1 TABLET BY MOUTH DAILY   No facility-administered encounter medications on file as of 05/04/2017.       Review of Systems  Constitutional: Negative.   HENT: Negative.   Eyes: Negative.   Respiratory: Negative.   Cardiovascular: Negative.   Gastrointestinal: Negative.   Endocrine: Negative.   Genitourinary: Negative.   Musculoskeletal: Negative.   Skin: Negative.   Allergic/Immunologic: Negative.   Neurological: Negative.   Hematological: Negative.   Psychiatric/Behavioral: Negative.        Objective:   Physical Exam  Constitutional: He is oriented to person, place, and time. He appears well-developed and well-nourished.  The patient is pleasant and alert and looks much younger and asked much younger than his stated age.  HENT:  Head: Normocephalic and atraumatic.  Right Ear: External ear normal.  Nose: Nose normal.  Mouth/Throat: Oropharynx is clear and moist. No oropharyngeal exudate.  Ears cerumen left ear canal which will be irrigated and removed before he leaves the office  Eyes: Pupils are equal, round, and reactive to light. Conjunctivae and EOM are normal. Right eye exhibits no discharge. Left eye exhibits no discharge. No scleral icterus.  Neck: Normal range of motion. Neck supple. No thyromegaly present.  No  bruits thyromegaly or anterior cervical adenopathy  Cardiovascular: Normal rate, regular rhythm, normal heart sounds and intact distal pulses.   No murmur heard. The heart has a regular rate and rhythm at 72/m with good pedal pulses  Pulmonary/Chest: Effort normal and breath sounds normal. No respiratory distress. He has no wheezes. He has no rales. He exhibits no tenderness.  Clear  anteriorly and posteriorly and no axillary adenopathy  Abdominal: Soft. Bowel sounds are normal. He exhibits no mass. There is no tenderness. There is no rebound and no guarding.  No liver or spleen enlargement. No masses. No bruits. No inguinal adenopathy.  Genitourinary:  Genitourinary Comments: This is followed yearly by Dr. Harriett Rush  Musculoskeletal: Normal range of motion. He exhibits no edema.  Lymphadenopathy:    He has no cervical adenopathy.  Neurological: He is alert and oriented to person, place, and time. He has normal reflexes. No cranial nerve deficit.  Skin: Skin is warm and dry. No rash noted.  Psychiatric: He has a normal mood and affect. His behavior is normal. Judgment and thought content normal.  Nursing note and vitals reviewed.  BP 128/70 (BP Location: Left Arm)   Pulse 68   Temp (!) 97.2 F (36.2 C) (Oral)   Ht 5' 11.5" (1.816 m)   Wt 170 lb (77.1 kg)   BMI 23.38 kg/m        Assessment & Plan:  1. Pure hypercholesterolemia -Continue with current treatment pending results of lab work - CBC with Differential/Platelet - Lipid panel  2. Essential hypertension -The blood pressure is excellent and he will continue with current treatment - BMP8+EGFR - CBC with Differential/Platelet - Hepatic function panel  3. Prostate cancer ([place]) -Continue to follow-up with urology - CBC with Differential/Platelet  4. Vitamin D deficiency -Continue with no vitamin D replacement because of decreased renal function. - CBC with Differential/Platelet - VITAMIN D 25 Hydroxy (Vit-D Deficiency, Fractures)  5. Hypothyroidism, unspecified type -Continue with current treatment pending results of lab work - CBC with Differential/Platelet - Thyroid Panel With TSH  6. Excessive cerumen in ear canal, left -Ear irrigation to remove cerumen left ear canal  7. Chronic kidney disease (CKD) stage G3b/A1, moderately decreased glomerular filtration rate (GFR) between 30-44  mL/min/1.73 square meter and albuminuria creatinine ratio less than 30 mg/g -Continue to follow-up with nephrology  8. Idiopathic gout, unspecified chronicity, unspecified site -Continue to follow-up with rheumatology  9. Umbilical hernia without obstruction and without gangrene -He has no complaints or problems with this hernia and it has not changed since the last visit.  Patient Instructions                       Medicare Annual Wellness Visit  [facility] and the medical providers at [facility] strive to bring you the best medical care.  In doing so we not only want to address your current medical conditions and concerns but also to detect new conditions early and prevent illness, disease and health-related problems.    Medicare offers a yearly Wellness Visit which allows our clinical staff to assess your need for preventative services including immunizations, lifestyle education, counseling to decrease risk of preventable diseases and screening for fall risk and other medical concerns.    This visit is provided free of charge (no copay) for all Medicare recipients. The clinical pharmacists at [facility] have begun to conduct these Wellness Visits which will also include a thorough review of  all your medications.    As you primary medical provider recommend that you make an appointment for your Annual Wellness Visit if you have not done so already this year.  You may set up this appointment before you leave today or you may call back ([phone]) and schedule an appointment.  Please make sure when you call that you mention that you are scheduling your Annual Wellness Visit with the clinical pharmacist so that the appointment may be made for the proper length of time.     Continue current medications. Continue good therapeutic lifestyle changes which include good diet and exercise. Fall precautions discussed with patient. If an FOBT was given  today- please return it to our front desk. If you are over 60 years old - you may need Prevnar 24 or the adult Pneumonia vaccine.  **Flu shots are available--- please call and schedule a FLU-CLINIC appointment**  After your visit with [place] today you will receive a survey in the mail or online from [employer] regarding your care with [place]. Please take a moment to fill this out. Your feedback is very important to [place] as you can help [place] better understand your patient needs as well as improve your experience and satisfaction. WE CARE ABOUT YOU!!!   The patient will get his flu shot today Continue to follow-up with the cardiologist and the nephrologist every 4 months as you are currently doing and the urologist and the rheumatologist yearly as you're doing are as they recommend Stay active physically and drink plenty of fluids    Arrie Senate MD

## 2017-05-05 ENCOUNTER — Telehealth: Payer: Self-pay | Admitting: Family Medicine

## 2017-05-05 LAB — CBC WITH DIFFERENTIAL/PLATELET
BASOS ABS: 0.1 10*3/uL (ref 0.0–0.2)
BASOS: 1 %
EOS (ABSOLUTE): 0.2 10*3/uL (ref 0.0–0.4)
Eos: 5 %
Hematocrit: 40.3 % (ref 37.5–51.0)
Hemoglobin: 13.3 g/dL (ref 13.0–17.7)
Immature Grans (Abs): 0 10*3/uL (ref 0.0–0.1)
Immature Granulocytes: 0 %
LYMPHS ABS: 1.3 10*3/uL (ref 0.7–3.1)
Lymphs: 37 %
MCH: 27 pg (ref 26.6–33.0)
MCHC: 33 g/dL (ref 31.5–35.7)
MCV: 82 fL (ref 79–97)
MONOS ABS: 0.6 10*3/uL (ref 0.1–0.9)
Monocytes: 18 %
NEUTROS ABS: 1.4 10*3/uL (ref 1.4–7.0)
Neutrophils: 39 %
PLATELETS: 182 10*3/uL (ref 150–379)
RBC: 4.93 x10E6/uL (ref 4.14–5.80)
RDW: 14.8 % (ref 12.3–15.4)
WBC: 3.5 10*3/uL (ref 3.4–10.8)

## 2017-05-05 LAB — BMP8+EGFR
BUN / CREAT RATIO: 12 (ref 10–24)
BUN: 28 mg/dL — ABNORMAL HIGH (ref 8–27)
CALCIUM: 10.5 mg/dL — AB (ref 8.6–10.2)
CHLORIDE: 105 mmol/L (ref 96–106)
CO2: 23 mmol/L (ref 20–29)
Creatinine, Ser: 2.42 mg/dL — ABNORMAL HIGH (ref 0.76–1.27)
GFR, EST AFRICAN AMERICAN: 27 mL/min/{1.73_m2} — AB (ref >59)
GFR, EST NON AFRICAN AMERICAN: 24 mL/min/{1.73_m2} — AB (ref >59)
Glucose: 89 mg/dL (ref 65–99)
POTASSIUM: 4.5 mmol/L (ref 3.5–5.2)
SODIUM: 142 mmol/L (ref 134–144)

## 2017-05-05 LAB — LIPID PANEL
CHOLESTEROL TOTAL: 149 mg/dL (ref 100–199)
Chol/HDL Ratio: 3.5 ratio (ref 0.0–5.0)
HDL: 43 mg/dL (ref >39)
LDL Calculated: 66 mg/dL (ref 0–99)
Triglycerides: 199 mg/dL — ABNORMAL HIGH (ref 0–149)
VLDL Cholesterol Cal: 40 mg/dL (ref 5–40)

## 2017-05-05 LAB — THYROID PANEL WITH TSH
Free Thyroxine Index: 2.3 (ref 1.2–4.9)
T3 UPTAKE RATIO: 30 % (ref 24–39)
T4 TOTAL: 7.7 ug/dL (ref 4.5–12.0)
TSH: 0.429 u[IU]/mL — AB (ref 0.450–4.500)

## 2017-05-05 LAB — HEPATIC FUNCTION PANEL
ALBUMIN: 4.2 g/dL (ref 3.5–4.7)
ALT: 10 IU/L (ref 0–44)
AST: 15 IU/L (ref 0–40)
Alkaline Phosphatase: 76 IU/L (ref 39–117)
Bilirubin Total: 0.4 mg/dL (ref 0.0–1.2)
Bilirubin, Direct: 0.13 mg/dL (ref 0.00–0.40)
Total Protein: 6.9 g/dL (ref 6.0–8.5)

## 2017-05-05 LAB — VITAMIN D 25 HYDROXY (VIT D DEFICIENCY, FRACTURES): Vit D, 25-Hydroxy: 16.8 ng/mL — ABNORMAL LOW (ref 30.0–100.0)

## 2017-05-08 NOTE — Telephone Encounter (Signed)
LM [id number] 

## 2017-05-10 DIAGNOSIS — N189 Chronic kidney disease, unspecified: Secondary | ICD-10-CM | POA: Diagnosis not present

## 2017-05-11 NOTE — Telephone Encounter (Signed)
Pt aware.

## 2017-08-23 DIAGNOSIS — I10 Essential (primary) hypertension: Secondary | ICD-10-CM | POA: Diagnosis not present

## 2017-08-23 DIAGNOSIS — N183 Chronic kidney disease, stage 3 (moderate): Secondary | ICD-10-CM | POA: Diagnosis not present

## 2017-09-12 DIAGNOSIS — Z45010 Encounter for checking and testing of cardiac pacemaker pulse generator [battery]: Secondary | ICD-10-CM | POA: Diagnosis not present

## 2017-09-12 DIAGNOSIS — I447 Left bundle-branch block, unspecified: Secondary | ICD-10-CM | POA: Diagnosis not present

## 2017-09-12 DIAGNOSIS — I10 Essential (primary) hypertension: Secondary | ICD-10-CM | POA: Diagnosis not present

## 2017-09-12 DIAGNOSIS — Z45018 Encounter for adjustment and management of other part of cardiac pacemaker: Secondary | ICD-10-CM | POA: Diagnosis not present

## 2017-09-12 DIAGNOSIS — Z950 Presence of cardiac pacemaker: Secondary | ICD-10-CM | POA: Diagnosis not present

## 2017-09-22 ENCOUNTER — Encounter: Payer: Self-pay | Admitting: Family Medicine

## 2017-09-22 ENCOUNTER — Ambulatory Visit: Payer: Medicare Other | Admitting: Family Medicine

## 2017-09-22 ENCOUNTER — Ambulatory Visit (INDEPENDENT_AMBULATORY_CARE_PROVIDER_SITE_OTHER): Payer: Medicare Other

## 2017-09-22 VITALS — BP 113/67 | HR 60 | Temp 97.0°F | Ht 71.5 in | Wt 173.0 lb

## 2017-09-22 DIAGNOSIS — C61 Malignant neoplasm of prostate: Secondary | ICD-10-CM | POA: Diagnosis not present

## 2017-09-22 DIAGNOSIS — I739 Peripheral vascular disease, unspecified: Secondary | ICD-10-CM

## 2017-09-22 DIAGNOSIS — N184 Chronic kidney disease, stage 4 (severe): Secondary | ICD-10-CM

## 2017-09-22 DIAGNOSIS — M1000 Idiopathic gout, unspecified site: Secondary | ICD-10-CM

## 2017-09-22 DIAGNOSIS — I10 Essential (primary) hypertension: Secondary | ICD-10-CM | POA: Diagnosis not present

## 2017-09-22 DIAGNOSIS — I129 Hypertensive chronic kidney disease with stage 1 through stage 4 chronic kidney disease, or unspecified chronic kidney disease: Secondary | ICD-10-CM

## 2017-09-22 DIAGNOSIS — E559 Vitamin D deficiency, unspecified: Secondary | ICD-10-CM | POA: Diagnosis not present

## 2017-09-22 DIAGNOSIS — N1832 Chronic kidney disease, stage 3b: Secondary | ICD-10-CM

## 2017-09-22 DIAGNOSIS — E7800 Pure hypercholesterolemia, unspecified: Secondary | ICD-10-CM

## 2017-09-22 DIAGNOSIS — E039 Hypothyroidism, unspecified: Secondary | ICD-10-CM

## 2017-09-22 DIAGNOSIS — N183 Chronic kidney disease, stage 3 (moderate): Secondary | ICD-10-CM | POA: Diagnosis not present

## 2017-09-22 DIAGNOSIS — H6122 Impacted cerumen, left ear: Secondary | ICD-10-CM

## 2017-09-22 NOTE — Progress Notes (Addendum)
Subjective:    Patient ID: Ivan West, male    DOB: 23-Jan-1931, 84 y.o.   MRN: [id number]  HPI Pt here for follow up and management of chronic medical problems which includes hypertension, hyperlipemia and hypothyroid. He is taking medication regularly.  The patient is doing well overall and as usual has no complaints and is not needing any refills.  He gets his rectal exams done by the urologist because of his prostate cancer.  He will get a chest x-ray today and lab work today.  He is followed regularly by the rheumatologist the cardiologist and the nephrologist.  The patient today denies any symptoms of chest pain or shortness of breath.  He denies any change in bowel habits nausea vomiting diarrhea blood in the stool or black tarry bowel movements.  He says he is passing his water well and only has slight problems with incontinence.  He does have the wax in his left ear and we will have that removed before he leaves the office.  He will continue to follow-up with office specialist.    Patient Active Problem List   Diagnosis Date Noted  . Primary gout 01/22/2015  . CKD (chronic kidney disease) 01/22/2015  . Vitamin D deficiency 01/22/2015  . Hypothyroidism 07/08/2013  . Hyperlipidemia 07/08/2013  . Elevated PSA 07/08/2013  . Prostate cancer ([place]) 07/08/2013  . CAD (coronary artery disease) 07/08/2013  . Benign hypertension with CKD (chronic kidney disease) stage IV ([place]) 01/29/2013  . Elevated serum creatinine 01/29/2013  . Gout 01/29/2013   Outpatient Encounter Medications as of 09/22/2017  Medication Sig  . amLODipine (NORVASC) 10 MG tablet TAKE (1) TABLET DAILY  . aspirin 81 MG tablet Take 81 mg by mouth daily.  . carvedilol (COREG) 12.5 MG tablet Take 12.5 mg by mouth 2 (two) times daily with a meal.  . febuxostat (ULORIC) 40 MG tablet Take 80 mg by mouth daily.  . fish oil-omega-3 fatty acids 1000 MG capsule Take 1 g by mouth daily.  . furosemide (LASIX) 20 MG tablet TAKE 1  TABLET AS DIRECTED AS NEEDED.  Marland Kitchen isosorbide mononitrate (IMDUR) 30 MG 24 hr tablet Take 1 tablet by mouth at bedtime.  Marland Kitchen levothyroxine (SYNTHROID, LEVOTHROID) 75 MCG tablet TAKE 1 TABLET BY MOUTH DAILY   No facility-administered encounter medications on file as of 09/22/2017.       Review of Systems  Constitutional: Negative.   HENT: Negative.   Eyes: Negative.   Respiratory: Negative.   Cardiovascular: Negative.   Gastrointestinal: Negative.   Endocrine: Negative.   Genitourinary: Negative.   Musculoskeletal: Negative.   Skin: Negative.   Allergic/Immunologic: Negative.   Neurological: Negative.   Hematological: Negative.   Psychiatric/Behavioral: Negative.        Objective:   Physical Exam  Constitutional: He is oriented to person, place, and time. He appears well-developed and well-nourished. No distress.  The patient is pleasant and alert and continues to feel extremely well without any complaints whatsoever after re-asking him these questions.  HENT:  Head: Normocephalic and atraumatic.  Right Ear: External ear normal.  Nose: Nose normal.  Mouth/Throat: Oropharynx is clear and moist. No oropharyngeal exudate.  Ear cerumen left ear canal.  The nurse will be instructed to use lavage to remove ear cerumen from this ear canal.  Eyes: Conjunctivae and EOM are normal. Pupils are equal, round, and reactive to light. Right eye exhibits no discharge. Left eye exhibits no discharge. No scleral icterus.  Neck: Normal range of  motion. Neck supple. No thyromegaly present.  No bruits thyromegaly or anterior cervical adenopathy  Cardiovascular: Normal rate, regular rhythm and normal heart sounds.  No murmur heard. Heart is regular at 72/min with diminished pedal pulses bilaterally.  The patient denies any claudication symptoms.  Pulmonary/Chest: Effort normal and breath sounds normal. No respiratory distress. He has no wheezes. He has no rales. He exhibits no tenderness.  Clear  anteriorly and posteriorly and no axillary adenopathy or chest wall masses.  Abdominal: Soft. Bowel sounds are normal. He exhibits no mass. There is no tenderness. There is no rebound and no guarding.  Patient continues to have an umbilical hernia which does not give him any problems.  There is no liver or spleen enlargement no epigastric tenderness but he does have inguinal pulses.  There is no inguinal adenopathy.  Genitourinary:  Genitourinary Comments: Patient sees the urologist, Dr. Ree Edman regularly.  Musculoskeletal: Normal range of motion. He exhibits no edema.  Lymphadenopathy:    He has no cervical adenopathy.  Neurological: He is alert and oriented to person, place, and time. He has normal reflexes. No cranial nerve deficit.  Skin: Skin is warm and dry. No rash noted.  Psychiatric: He has a normal mood and affect. His behavior is normal. Judgment and thought content normal.  Nursing note and vitals reviewed.  BP 113/67 (BP Location: Left Arm)   Pulse 60   Temp (!) 97 F (36.1 C) (Oral)   Ht 5' 11.5" (1.816 m)   Wt 173 lb (78.5 kg)   BMI 23.79 kg/m         Assessment & Plan:  1. Essential hypertension -The blood pressure is good today and he will continue with current treatment  2. Pure hypercholesterolemia -Continue with omega-3 fatty acids and therapeutic lifestyle changes pending results of lab work  3. Prostate cancer [facility]) -Continue yearly follow-up with urologist Dr. Ree Edman  4. Vitamin D deficiency -Vitamin D replacement her nephrologist direction.  5. Hypothyroidism, unspecified type -Continue current treatment pending results of lab work  6. Chronic kidney disease (CKD) stage G3b/A1, moderately decreased glomerular filtration rate (GFR) between 30-44 mL/min/1.73 square meter and albuminuria creatinine ratio less than 30 mg/g  -Follow-up with [facility] regularly.  7. Idiopathic gout, unspecified chronicity, unspecified site -Continue to follow-up with  rheumatology and whoever replaces Dr. Esperanza Sheets  8. Peripheral vascular insufficiency ([place]) -Patient is walking regularly and has no leg pain currently.  He will let the cardiologist or myself know if any pain develops.  9. Benign hypertension with CKD (chronic kidney disease) stage IV ([facility]) -Pressure good control today.  Sees nephrologist regularly.  10.  Left ear cerumen -Ear lavage to remove cerumen  Patient Instructions                       Medicare Annual Wellness Visit  [facility] and the medical providers at [facility] strive to bring you the best medical care.  In doing so we not only want to address your current medical conditions and concerns but also to detect new conditions early and prevent illness, disease and health-related problems.    Medicare offers a yearly Wellness Visit which allows our clinical staff to assess your need for preventative services including immunizations, lifestyle education, counseling to decrease risk of preventable diseases and screening for fall risk and other medical concerns.    This visit is provided free of charge (no copay) for all Medicare recipients. The clinical pharmacists at  [facility] have begun to conduct these Wellness Visits which will also include a thorough review of all your medications.    As you primary medical provider recommend that you make an appointment for your Annual Wellness Visit if you have not done so already this year.  You may set up this appointment before you leave today or you may call back ([phone]) and schedule an appointment.  Please make sure when you call that you mention that you are scheduling your Annual Wellness Visit with the clinical pharmacist so that the appointment may be made for the proper length of time.     Continue current medications. Continue good therapeutic lifestyle changes which include good diet and exercise. Fall precautions discussed with  patient. If an FOBT was given today- please return it to our front desk. If you are over 25 years old - you may need Prevnar 1 or the adult Pneumonia vaccine.  **Flu shots are available--- please call and schedule a FLU-CLINIC appointment**  After your visit with [place] today you will receive a survey in the mail or online from [employer] regarding your care with [place]. Please take a moment to fill this out. Your feedback is very important to [place] as you can help [place] better understand your patient needs as well as improve your experience and satisfaction. WE CARE ABOUT YOU!!!  The patient will continue to follow-up with his urologist his rheumatologist his cardiologist and nephrologist.  Let us know if he did start developing leg pain with walking.   Arrie Senate MD

## 2017-09-22 NOTE — Patient Instructions (Addendum)
Medicare Annual Wellness Visit  [facility] and the medical providers at [facility] strive to bring you the best medical care.  In doing so we not only want to address your current medical conditions and concerns but also to detect new conditions early and prevent illness, disease and health-related problems.    Medicare offers a yearly Wellness Visit which allows our clinical staff to assess your need for preventative services including immunizations, lifestyle education, counseling to decrease risk of preventable diseases and screening for fall risk and other medical concerns.    This visit is provided free of charge (no copay) for all Medicare recipients. The clinical pharmacists at [facility] have begun to conduct these Wellness Visits which will also include a thorough review of all your medications.    As you primary medical provider recommend that you make an appointment for your Annual Wellness Visit if you have not done so already this year.  You may set up this appointment before you leave today or you may call back ([phone]) and schedule an appointment.  Please make sure when you call that you mention that you are scheduling your Annual Wellness Visit with the clinical pharmacist so that the appointment may be made for the proper length of time.     Continue current medications. Continue good therapeutic lifestyle changes which include good diet and exercise. Fall precautions discussed with patient. If an FOBT was given today- please return it to our front desk. If you are over 70 years old - you may need Prevnar 54 or the adult Pneumonia vaccine.  **Flu shots are available--- please call and schedule a FLU-CLINIC appointment**  After your visit with [place] today you will receive a survey in the mail or online from [employer] regarding your care with [place]. Please take a moment to fill this out. Your feedback is very  important to [place] as you can help [place] better understand your patient needs as well as improve your experience and satisfaction. WE CARE ABOUT YOU!!!  The patient will continue to follow-up with his urologist his rheumatologist his cardiologist and nephrologist.  Let us know if he did start developing leg pain with walking.

## 2017-09-22 NOTE — Addendum Note (Signed)
Addended by: Zannie Cove on: 09/22/2017 11:31 AM   Modules accepted: Orders

## 2017-09-23 LAB — HEPATIC FUNCTION PANEL
ALT: 14 IU/L (ref 0–44)
AST: 13 IU/L (ref 0–40)
Albumin: 4.5 g/dL (ref 3.5–4.7)
Alkaline Phosphatase: 93 IU/L (ref 39–117)
Bilirubin Total: 0.4 mg/dL (ref 0.0–1.2)
Bilirubin, Direct: 0.13 mg/dL (ref 0.00–0.40)
Total Protein: 7.3 g/dL (ref 6.0–8.5)

## 2017-09-23 LAB — CBC WITH DIFFERENTIAL/PLATELET
Basophils Absolute: 0 10*3/uL (ref 0.0–0.2)
Basos: 1 %
EOS (ABSOLUTE): 0.1 10*3/uL (ref 0.0–0.4)
EOS: 3 %
HEMATOCRIT: 43.2 % (ref 37.5–51.0)
Hemoglobin: 13.7 g/dL (ref 13.0–17.7)
IMMATURE GRANS (ABS): 0 10*3/uL (ref 0.0–0.1)
IMMATURE GRANULOCYTES: 0 %
LYMPHS: 34 %
Lymphocytes Absolute: 1.2 10*3/uL (ref 0.7–3.1)
MCH: 27.2 pg (ref 26.6–33.0)
MCHC: 31.7 g/dL (ref 31.5–35.7)
MCV: 86 fL (ref 79–97)
MONOS ABS: 0.6 10*3/uL (ref 0.1–0.9)
Monocytes: 16 %
NEUTROS PCT: 46 %
Neutrophils Absolute: 1.6 10*3/uL (ref 1.4–7.0)
Platelets: 207 10*3/uL (ref 150–379)
RBC: 5.04 x10E6/uL (ref 4.14–5.80)
RDW: 14.7 % (ref 12.3–15.4)
WBC: 3.5 10*3/uL (ref 3.4–10.8)

## 2017-09-23 LAB — LIPID PANEL
CHOL/HDL RATIO: 3.2 ratio (ref 0.0–5.0)
Cholesterol, Total: 147 mg/dL (ref 100–199)
HDL: 46 mg/dL (ref >39)
LDL CALC: 84 mg/dL (ref 0–99)
Triglycerides: 84 mg/dL (ref 0–149)
VLDL CHOLESTEROL CAL: 17 mg/dL (ref 5–40)

## 2017-09-23 LAB — BMP8+EGFR
BUN / CREAT RATIO: 11 (ref 10–24)
BUN: 29 mg/dL — ABNORMAL HIGH (ref 8–27)
CHLORIDE: 107 mmol/L — AB (ref 96–106)
CO2: 20 mmol/L (ref 20–29)
Calcium: 10.3 mg/dL — ABNORMAL HIGH (ref 8.6–10.2)
Creatinine, Ser: 2.57 mg/dL — ABNORMAL HIGH (ref 0.76–1.27)
GFR calc Af Amer: 25 mL/min/{1.73_m2} — ABNORMAL LOW (ref >59)
GFR calc non Af Amer: 22 mL/min/{1.73_m2} — ABNORMAL LOW (ref >59)
GLUCOSE: 108 mg/dL — AB (ref 65–99)
Potassium: 5 mmol/L (ref 3.5–5.2)
SODIUM: 146 mmol/L — AB (ref 134–144)

## 2017-09-23 LAB — URIC ACID: URIC ACID: 5.6 mg/dL (ref 3.7–8.6)

## 2017-09-23 LAB — VITAMIN D 25 HYDROXY (VIT D DEFICIENCY, FRACTURES): VIT D 25 HYDROXY: 13.8 ng/mL — AB (ref 30.0–100.0)

## 2017-10-10 ENCOUNTER — Telehealth: Payer: Self-pay | Admitting: Family Medicine

## 2017-10-10 NOTE — Telephone Encounter (Signed)
Patient aware of xray results.   

## 2017-11-13 DIAGNOSIS — I10 Essential (primary) hypertension: Secondary | ICD-10-CM | POA: Diagnosis not present

## 2017-11-13 DIAGNOSIS — N184 Chronic kidney disease, stage 4 (severe): Secondary | ICD-10-CM | POA: Diagnosis not present

## 2017-12-28 DIAGNOSIS — I10 Essential (primary) hypertension: Secondary | ICD-10-CM | POA: Diagnosis not present

## 2017-12-28 DIAGNOSIS — Z8546 Personal history of malignant neoplasm of prostate: Secondary | ICD-10-CM | POA: Diagnosis not present

## 2018-01-03 DIAGNOSIS — Z45018 Encounter for adjustment and management of other part of cardiac pacemaker: Secondary | ICD-10-CM | POA: Diagnosis not present

## 2018-01-03 DIAGNOSIS — Z45010 Encounter for checking and testing of cardiac pacemaker pulse generator [battery]: Secondary | ICD-10-CM | POA: Diagnosis not present

## 2018-01-12 ENCOUNTER — Other Ambulatory Visit: Payer: Self-pay | Admitting: Family Medicine

## 2018-02-07 ENCOUNTER — Encounter: Payer: Self-pay | Admitting: Family Medicine

## 2018-02-07 ENCOUNTER — Ambulatory Visit (INDEPENDENT_AMBULATORY_CARE_PROVIDER_SITE_OTHER): Payer: Medicare Other | Admitting: Family Medicine

## 2018-02-07 VITALS — BP 122/73 | HR 79 | Temp 97.6°F | Ht 71.5 in | Wt 169.0 lb

## 2018-02-07 DIAGNOSIS — I739 Peripheral vascular disease, unspecified: Secondary | ICD-10-CM

## 2018-02-07 DIAGNOSIS — E559 Vitamin D deficiency, unspecified: Secondary | ICD-10-CM

## 2018-02-07 DIAGNOSIS — N1832 Chronic kidney disease, stage 3b: Secondary | ICD-10-CM

## 2018-02-07 DIAGNOSIS — E7800 Pure hypercholesterolemia, unspecified: Secondary | ICD-10-CM

## 2018-02-07 DIAGNOSIS — M1000 Idiopathic gout, unspecified site: Secondary | ICD-10-CM

## 2018-02-07 DIAGNOSIS — N183 Chronic kidney disease, stage 3 (moderate): Secondary | ICD-10-CM

## 2018-02-07 DIAGNOSIS — I129 Hypertensive chronic kidney disease with stage 1 through stage 4 chronic kidney disease, or unspecified chronic kidney disease: Secondary | ICD-10-CM

## 2018-02-07 DIAGNOSIS — E039 Hypothyroidism, unspecified: Secondary | ICD-10-CM | POA: Diagnosis not present

## 2018-02-07 DIAGNOSIS — I10 Essential (primary) hypertension: Secondary | ICD-10-CM

## 2018-02-07 DIAGNOSIS — C61 Malignant neoplasm of prostate: Secondary | ICD-10-CM

## 2018-02-07 DIAGNOSIS — N184 Chronic kidney disease, stage 4 (severe): Secondary | ICD-10-CM

## 2018-02-07 NOTE — Progress Notes (Signed)
Subjective:    Patient ID: Ivan West, male    DOB: 1930/09/02, 84 y.o.   MRN: [id number]  HPI Pt here for follow up and management of chronic medical problems which includes hypertension, hyperlipidemia and hypothyroid. He is taking medication regularly.  She is doing well overall with no specific complaints.  He usually has no complaints.  He has a history of complete heart block stage IV chronic kidney disease prostate cancer and he is followed regularly by specialist and this feels.  He sees the nephrologist the cardiologist and the urologist regularly.  His rectal exam is done by the urologist regularly.  He was given an FOBT to return today.  He will get lab work today.  Is also on thyroid replacement and takes imdur for his coronary artery disease.  Patient today denies any chest pain pressure tightness or shortness of breath.  He does have occasional heartburn and indigestion but not often.  He is currently taking peppermint for this and I told him to stay away from peppermint.  He denies any blood in the stool black tarry bowel movements or constipation or diarrhea.  He is passing his water without problems.  He also does not describe any issues with his gout today.    Patient Active Problem List   Diagnosis Date Noted  . Primary gout 01/22/2015  . CKD (chronic kidney disease) 01/22/2015  . Vitamin D deficiency 01/22/2015  . Hypothyroidism 07/08/2013  . Hyperlipidemia 07/08/2013  . Elevated PSA 07/08/2013  . Prostate cancer ([place]) 07/08/2013  . CAD (coronary artery disease) 07/08/2013  . Benign hypertension with CKD (chronic kidney disease) stage IV ([place]) 01/29/2013  . Elevated serum creatinine 01/29/2013  . Gout 01/29/2013   Outpatient Encounter Medications as of 02/07/2018  Medication Sig  . amLODipine (NORVASC) 10 MG tablet TAKE (1) TABLET DAILY  . aspirin 81 MG tablet Take 81 mg by mouth daily.  . carvedilol (COREG) 12.5 MG tablet Take 12.5 mg by mouth 2 (two) times daily  with a meal.  . febuxostat (ULORIC) 40 MG tablet Take 80 mg by mouth daily.  . fish oil-omega-3 fatty acids 1000 MG capsule Take 1 g by mouth daily.  . furosemide (LASIX) 20 MG tablet TAKE 1 TABLET AS DIRECTED AS NEEDED.  Marland Kitchen levothyroxine (SYNTHROID, LEVOTHROID) 75 MCG tablet TAKE 1 TABLET BY MOUTH DAILY  . isosorbide mononitrate (IMDUR) 30 MG 24 hr tablet Take 1 tablet by mouth at bedtime.   No facility-administered encounter medications on file as of 02/07/2018.      Review of Systems  Constitutional: Negative.   HENT: Negative.   Eyes: Negative.   Respiratory: Negative.   Cardiovascular: Negative.   Gastrointestinal: Negative.   Endocrine: Negative.   Genitourinary: Negative.   Musculoskeletal: Negative.   Skin: Negative.   Allergic/Immunologic: Negative.   Neurological: Negative.   Hematological: Negative.   Psychiatric/Behavioral: Negative.        Objective:   Physical Exam  Constitutional: He is oriented to person, place, and time. He appears well-developed and well-nourished. No distress.  The patient is pleasant and relaxed and appears much younger than his stated age as usual.  HENT:  Head: Normocephalic and atraumatic.  Right Ear: External ear normal.  Left Ear: External ear normal.  Nose: Nose normal.  Mouth/Throat: Oropharynx is clear and moist. No oropharyngeal exudate.  Eyes: Pupils are equal, round, and reactive to light. Conjunctivae and EOM are normal. Right eye exhibits no discharge. Left eye exhibits no discharge.  No scleral icterus.  Neck: Normal range of motion. Neck supple. No thyromegaly present.  No bruits thyromegaly or anterior cervical adenopathy  Cardiovascular: Normal rate, regular rhythm and normal heart sounds.  No murmur heard. The heart is regular at 60/min distal pulses were not palpable in either extremity but there was a left inguinal pulse that was palpable but the right inguinal pulse was not.  Pulmonary/Chest: Effort normal and breath  sounds normal. No respiratory distress. He has no wheezes. He has no rales. He exhibits no tenderness.  Abdominal: Soft. Bowel sounds are normal. He exhibits no mass. There is no tenderness. There is no rebound and no guarding. A hernia is present.  There is an umbilical hernia which the patient has had for a good while and this is not causing any problems.  There were no masses tenderness organ enlargement or bruits.  Musculoskeletal: Normal range of motion. He exhibits no edema, tenderness or deformity.  Lymphadenopathy:    He has no cervical adenopathy.  Neurological: He is alert and oriented to person, place, and time. He has normal reflexes. No cranial nerve deficit.  Skin: Skin is warm and dry. No rash noted.  Psychiatric: He has a normal mood and affect. His behavior is normal. Judgment and thought content normal.  The patient's mood affect and behavior were normal.  Nursing note and vitals reviewed.  BP 122/73 (BP Location: Left Arm)   Pulse 79   Temp 97.6 F (36.4 C) (Oral)   Ht 5' 11.5" (1.816 m)   Wt 169 lb (76.7 kg)   BMI 23.24 kg/m         Assessment & Plan:  1. Essential hypertension -Blood pressure was excellent today.  He will continue with his current treatment regimen and regular follow-up with a nephrologist. - BMP8+EGFR - CBC with Differential/Platelet - Hepatic function panel  2. Pure hypercholesterolemia -Continue with current treatment and aggressive therapeutic lifestyle changes pending results of lab work - CBC with Differential/Platelet - Lipid panel  3. Prostate cancer ([place]) -Continue to follow-up with urology - CBC with Differential/Platelet  4. Vitamin D deficiency -Continue with any treatment recommendations that come from the nephrologist. - CBC with Differential/Platelet - VITAMIN D 25 Hydroxy (Vit-D Deficiency, Fractures)  5. Hypothyroidism, unspecified type -Continue with current treatment pending results of lab work - CBC with  Differential/Platelet - Thyroid Panel With TSH  6. Chronic kidney disease (CKD) stage G3b/A1, moderately decreased glomerular filtration rate (GFR) between 30-44 mL/min/1.73 square meter and albuminuria creatinine ratio less than 30 mg/g (HCC) -Continue follow-up with nephrology - BMP8+EGFR - CBC with Differential/Platelet  7. Idiopathic gout, unspecified chronicity, unspecified site -Follow-up with rheumatology as planned in September - Uric acid  8. Peripheral vascular insufficiency ([place]) -Patient denies any claudication symptoms.  There were inguinal pulses present in the left inguinal area but not in the right inguinal area.  9. Hypertensive kidney disease with chronic kidney disease stage IV ([place]) -Continue with current treatment and follow-up with nephrology  Patient Instructions                       Medicare Annual Wellness Visit  [facility] and the medical providers at [facility] strive to bring you the best medical care.  In doing so we not only want to address your current medical conditions and concerns but also to detect new conditions early and prevent illness, disease and health-related problems.    Medicare offers a yearly Wellness  Visit which allows our clinical staff to assess your need for preventative services including immunizations, lifestyle education, counseling to decrease risk of preventable diseases and screening for fall risk and other medical concerns.    This visit is provided free of charge (no copay) for all Medicare recipients. The clinical pharmacists at [facility] have begun to conduct these Wellness Visits which will also include a thorough review of all your medications.    As you primary medical provider recommend that you make an appointment for your Annual Wellness Visit if you have not done so already this year.  You may set up this appointment before you leave today or you may call back ([phone])  and schedule an appointment.  Please make sure when you call that you mention that you are scheduling your Annual Wellness Visit with the clinical pharmacist so that the appointment may be made for the proper length of time.     Continue current medications. Continue good therapeutic lifestyle changes which include good diet and exercise. Fall precautions discussed with patient. If an FOBT was given today- please return it to our front desk. If you are over 8 years old - you may need Prevnar 38 or the adult Pneumonia vaccine.  **Flu shots are available--- please call and schedule a FLU-CLINIC appointment**  After your visit with [place] today you will receive a survey in the mail or online from [employer] regarding your care with [place]. Please take a moment to fill this out. Your feedback is very important to [place] as you can help [place] better understand your patient needs as well as improve your experience and satisfaction. WE CARE ABOUT YOU!!!   You can purchase ranitidine 150 mg, the equate brand at [facility] and you can take this on an as-needed basis no more than twice daily if needed for heartburn and indigestion Avoid peppermint Continue to follow-up with cardiology rheumatology nephrology and urology This summer drink plenty of fluids and stay well-hydrated and do not work in the heat of the day  If you develop any calf or leg pain with walking you should let the cardiologist or myself know about this.  This could mean claudication or lack of circulation to the lower extremities   Arrie Senate MD

## 2018-02-07 NOTE — Patient Instructions (Addendum)
Medicare Annual Wellness Visit  [facility] and the medical providers at [facility] strive to bring you the best medical care.  In doing so we not only want to address your current medical conditions and concerns but also to detect new conditions early and prevent illness, disease and health-related problems.    Medicare offers a yearly Wellness Visit which allows our clinical staff to assess your need for preventative services including immunizations, lifestyle education, counseling to decrease risk of preventable diseases and screening for fall risk and other medical concerns.    This visit is provided free of charge (no copay) for all Medicare recipients. The clinical pharmacists at [facility] have begun to conduct these Wellness Visits which will also include a thorough review of all your medications.    As you primary medical provider recommend that you make an appointment for your Annual Wellness Visit if you have not done so already this year.  You may set up this appointment before you leave today or you may call back ([phone]) and schedule an appointment.  Please make sure when you call that you mention that you are scheduling your Annual Wellness Visit with the clinical pharmacist so that the appointment may be made for the proper length of time.     Continue current medications. Continue good therapeutic lifestyle changes which include good diet and exercise. Fall precautions discussed with patient. If an FOBT was given today- please return it to our front desk. If you are over 62 years old - you may need Prevnar 14 or the adult Pneumonia vaccine.  **Flu shots are available--- please call and schedule a FLU-CLINIC appointment**  After your visit with [place] today you will receive a survey in the mail or online from [employer] regarding your care with [place]. Please take a moment to fill this out. Your feedback is very  important to [place] as you can help [place] better understand your patient needs as well as improve your experience and satisfaction. WE CARE ABOUT YOU!!!   You can purchase ranitidine 150 mg, the equate brand at [facility] and you can take this on an as-needed basis no more than twice daily if needed for heartburn and indigestion Avoid peppermint Continue to follow-up with cardiology rheumatology nephrology and urology This summer drink plenty of fluids and stay well-hydrated and do not work in the heat of the day  If you develop any calf or leg pain with walking you should let the cardiologist or myself know about this.  This could mean claudication or lack of circulation to the lower extremities

## 2018-02-08 LAB — CBC WITH DIFFERENTIAL/PLATELET
BASOS ABS: 0 10*3/uL (ref 0.0–0.2)
Basos: 1 %
EOS (ABSOLUTE): 0.1 10*3/uL (ref 0.0–0.4)
Eos: 4 %
Hematocrit: 41.5 % (ref 37.5–51.0)
Hemoglobin: 13.1 g/dL (ref 13.0–17.7)
IMMATURE GRANS (ABS): 0 10*3/uL (ref 0.0–0.1)
Immature Granulocytes: 0 %
LYMPHS ABS: 1 10*3/uL (ref 0.7–3.1)
LYMPHS: 32 %
MCH: 26.5 pg — AB (ref 26.6–33.0)
MCHC: 31.6 g/dL (ref 31.5–35.7)
MCV: 84 fL (ref 79–97)
MONOCYTES: 14 %
MONOS ABS: 0.5 10*3/uL (ref 0.1–0.9)
NEUTROS ABS: 1.6 10*3/uL (ref 1.4–7.0)
Neutrophils: 49 %
PLATELETS: 184 10*3/uL (ref 150–450)
RBC: 4.94 x10E6/uL (ref 4.14–5.80)
RDW: 13.7 % (ref 12.3–15.4)
WBC: 3.2 10*3/uL — ABNORMAL LOW (ref 3.4–10.8)

## 2018-02-08 LAB — LIPID PANEL
Chol/HDL Ratio: 3.6 ratio (ref 0.0–5.0)
Cholesterol, Total: 151 mg/dL (ref 100–199)
HDL: 42 mg/dL (ref >39)
LDL Calculated: 90 mg/dL (ref 0–99)
Triglycerides: 97 mg/dL (ref 0–149)
VLDL CHOLESTEROL CAL: 19 mg/dL (ref 5–40)

## 2018-02-08 LAB — HEPATIC FUNCTION PANEL
ALT: 12 IU/L (ref 0–44)
AST: 13 IU/L (ref 0–40)
Albumin: 4.2 g/dL (ref 3.5–4.7)
Alkaline Phosphatase: 76 IU/L (ref 39–117)
Bilirubin Total: 0.6 mg/dL (ref 0.0–1.2)
Bilirubin, Direct: 0.15 mg/dL (ref 0.00–0.40)
Total Protein: 6.8 g/dL (ref 6.0–8.5)

## 2018-02-08 LAB — BMP8+EGFR
BUN/Creatinine Ratio: 12 (ref 10–24)
BUN: 30 mg/dL — AB (ref 8–27)
CALCIUM: 10.3 mg/dL — AB (ref 8.6–10.2)
CHLORIDE: 105 mmol/L (ref 96–106)
CO2: 22 mmol/L (ref 20–29)
Creatinine, Ser: 2.46 mg/dL — ABNORMAL HIGH (ref 0.76–1.27)
GFR, EST AFRICAN AMERICAN: 27 mL/min/{1.73_m2} — AB (ref >59)
GFR, EST NON AFRICAN AMERICAN: 23 mL/min/{1.73_m2} — AB (ref >59)
Glucose: 102 mg/dL — ABNORMAL HIGH (ref 65–99)
Potassium: 4.4 mmol/L (ref 3.5–5.2)
Sodium: 139 mmol/L (ref 134–144)

## 2018-02-08 LAB — URIC ACID: Uric Acid: 5 mg/dL (ref 3.7–8.6)

## 2018-02-08 LAB — VITAMIN D 25 HYDROXY (VIT D DEFICIENCY, FRACTURES): VIT D 25 HYDROXY: 12.8 ng/mL — AB (ref 30.0–100.0)

## 2018-02-08 LAB — THYROID PANEL WITH TSH
Free Thyroxine Index: 2.6 (ref 1.2–4.9)
T3 Uptake Ratio: 34 % (ref 24–39)
T4, Total: 7.5 ug/dL (ref 4.5–12.0)
TSH: 0.264 u[IU]/mL — ABNORMAL LOW (ref 0.450–4.500)

## 2018-02-12 DIAGNOSIS — N184 Chronic kidney disease, stage 4 (severe): Secondary | ICD-10-CM | POA: Diagnosis not present

## 2018-02-12 DIAGNOSIS — I10 Essential (primary) hypertension: Secondary | ICD-10-CM | POA: Diagnosis not present

## 2018-02-13 ENCOUNTER — Other Ambulatory Visit: Payer: Medicare Other

## 2018-02-13 DIAGNOSIS — Z1211 Encounter for screening for malignant neoplasm of colon: Secondary | ICD-10-CM | POA: Diagnosis not present

## 2018-02-15 LAB — FECAL OCCULT BLOOD, IMMUNOCHEMICAL: FECAL OCCULT BLD: POSITIVE — AB

## 2018-02-16 ENCOUNTER — Other Ambulatory Visit: Payer: Self-pay

## 2018-02-16 DIAGNOSIS — R195 Other fecal abnormalities: Secondary | ICD-10-CM

## 2018-03-01 ENCOUNTER — Other Ambulatory Visit: Payer: Medicare Other

## 2018-03-01 DIAGNOSIS — R195 Other fecal abnormalities: Secondary | ICD-10-CM

## 2018-03-04 LAB — FECAL OCCULT BLOOD, IMMUNOCHEMICAL: FECAL OCCULT BLD: POSITIVE — AB

## 2018-03-19 DIAGNOSIS — R195 Other fecal abnormalities: Secondary | ICD-10-CM | POA: Diagnosis not present

## 2018-03-19 DIAGNOSIS — K921 Melena: Secondary | ICD-10-CM | POA: Diagnosis not present

## 2018-04-10 ENCOUNTER — Telehealth: Payer: Self-pay | Admitting: Family Medicine

## 2018-04-10 MED ORDER — FEBUXOSTAT 40 MG PO TABS: 80.0000 mg | ORAL_TABLET | Freq: Every day | ORAL | 1 refills | Status: DC

## 2018-04-10 NOTE — Telephone Encounter (Signed)
What is the name of the medication? febuxostat (ULORIC) 40 MG tablet  Have you contacted your pharmacy to request a refill? no  Which pharmacy would you like this sent to? Family pharmacy [facility]   Patient notified that their request is being sent to the clinical staff for review and that they should receive a call once it is complete. If they do not receive a call within 24 hours they can check with their pharmacy or our office.    FYI for Dr Laurance Flatten fish oil-omega-3 fatty acids 1000 MG capsule has been decreased to 500mg .

## 2018-04-10 NOTE — Telephone Encounter (Signed)
Rx sent to pharmacy   

## 2018-04-11 ENCOUNTER — Telehealth: Payer: Self-pay | Admitting: Family Medicine

## 2018-04-11 NOTE — Telephone Encounter (Signed)
Please check with patient's nephrologist in [place].  I think it is Dr. Wyvonnia Lora, confirm this with the patient.  He has renal insufficiency and I do not think that changing to allopurinol is appropriate.  Please see what she says.  Maybe we can get an override from his insurance.

## 2018-04-11 NOTE — Telephone Encounter (Signed)
spoke with daughter - aware of black box warning with uloric and that we will contact nephrologist for recommendation

## 2018-04-11 NOTE — Telephone Encounter (Signed)
LM [id number]/28-jhb

## 2018-04-18 DIAGNOSIS — I442 Atrioventricular block, complete: Secondary | ICD-10-CM | POA: Diagnosis not present

## 2018-04-18 DIAGNOSIS — I10 Essential (primary) hypertension: Secondary | ICD-10-CM | POA: Diagnosis not present

## 2018-04-18 DIAGNOSIS — Z950 Presence of cardiac pacemaker: Secondary | ICD-10-CM | POA: Diagnosis not present

## 2018-05-14 ENCOUNTER — Other Ambulatory Visit: Payer: Self-pay | Admitting: Family Medicine

## 2018-05-24 DIAGNOSIS — H40033 Anatomical narrow angle, bilateral: Secondary | ICD-10-CM | POA: Diagnosis not present

## 2018-05-24 DIAGNOSIS — H2513 Age-related nuclear cataract, bilateral: Secondary | ICD-10-CM | POA: Diagnosis not present

## 2018-05-30 ENCOUNTER — Ambulatory Visit (INDEPENDENT_AMBULATORY_CARE_PROVIDER_SITE_OTHER): Payer: Medicare Other

## 2018-05-30 DIAGNOSIS — Z23 Encounter for immunization: Secondary | ICD-10-CM | POA: Diagnosis not present

## 2018-06-13 ENCOUNTER — Encounter: Payer: Self-pay | Admitting: Family Medicine

## 2018-06-13 ENCOUNTER — Ambulatory Visit (INDEPENDENT_AMBULATORY_CARE_PROVIDER_SITE_OTHER): Payer: Medicare Other | Admitting: Family Medicine

## 2018-06-13 VITALS — BP 134/75 | HR 68 | Temp 96.7°F | Ht 71.5 in | Wt 177.0 lb

## 2018-06-13 DIAGNOSIS — I10 Essential (primary) hypertension: Secondary | ICD-10-CM

## 2018-06-13 DIAGNOSIS — E7800 Pure hypercholesterolemia, unspecified: Secondary | ICD-10-CM | POA: Diagnosis not present

## 2018-06-13 DIAGNOSIS — D225 Melanocytic nevi of trunk: Secondary | ICD-10-CM | POA: Diagnosis not present

## 2018-06-13 DIAGNOSIS — C61 Malignant neoplasm of prostate: Secondary | ICD-10-CM | POA: Diagnosis not present

## 2018-06-13 DIAGNOSIS — M1000 Idiopathic gout, unspecified site: Secondary | ICD-10-CM

## 2018-06-13 DIAGNOSIS — E559 Vitamin D deficiency, unspecified: Secondary | ICD-10-CM

## 2018-06-13 DIAGNOSIS — E039 Hypothyroidism, unspecified: Secondary | ICD-10-CM

## 2018-06-13 DIAGNOSIS — K429 Umbilical hernia without obstruction or gangrene: Secondary | ICD-10-CM

## 2018-06-13 DIAGNOSIS — N183 Chronic kidney disease, stage 3 (moderate): Secondary | ICD-10-CM

## 2018-06-13 DIAGNOSIS — I739 Peripheral vascular disease, unspecified: Secondary | ICD-10-CM

## 2018-06-13 DIAGNOSIS — R195 Other fecal abnormalities: Secondary | ICD-10-CM

## 2018-06-13 DIAGNOSIS — N1832 Chronic kidney disease, stage 3b: Secondary | ICD-10-CM

## 2018-06-13 NOTE — Patient Instructions (Addendum)
Medicare Annual Wellness Visit  [facility] and the medical providers at [facility] strive to bring you the best medical care.  In doing so we not only want to address your current medical conditions and concerns but also to detect new conditions early and prevent illness, disease and health-related problems.    Medicare offers a yearly Wellness Visit which allows our clinical staff to assess your need for preventative services including immunizations, lifestyle education, counseling to decrease risk of preventable diseases and screening for fall risk and other medical concerns.    This visit is provided free of charge (no copay) for all Medicare recipients. The clinical pharmacists at [facility] have begun to conduct these Wellness Visits which will also include a thorough review of all your medications.    As you primary medical provider recommend that you make an appointment for your Annual Wellness Visit if you have not done so already this year.  You may set up this appointment before you leave today or you may call back ([phone]) and schedule an appointment.  Please make sure when you call that you mention that you are scheduling your Annual Wellness Visit with the clinical pharmacist so that the appointment may be made for the proper length of time.     Continue current medications. Continue good therapeutic lifestyle changes which include good diet and exercise. Fall precautions discussed with patient. If an FOBT was given today- please return it to our front desk. If you are over 41 years old - you may need Prevnar 14 or the adult Pneumonia vaccine.  **Flu shots are available--- please call and schedule a FLU-CLINIC appointment**  After your visit with [place] today you will receive a survey in the mail or online from [employer] regarding your care with [place]. Please take a moment to fill this out. Your feedback is very  important to [place] as you can help [place] better understand your patient needs as well as improve your experience and satisfaction. WE CARE ABOUT YOU!!!   Continue to follow-up regularly with cardiology urology nephrology.  Follow-up with gastroenterology as needed and with rheumatology once we find out who the new doctor is and where they are located. Continue to drink plenty of fluids and stay well-hydrated Instead of taking Zantac, you may take Pepcid AC as needed for heartburn and indigestion just at the same frequency that you would take the Zantac. According to the computer you are currently taking furosemide 20 mg 1 daily as directed.  We would ask that you take this to daily at least until you see the nephrologist next week and she may want to at that time check another BMP and make sure that your weight is down since it was up from 1 69-1 77 today with the pedal edema.

## 2018-06-13 NOTE — Progress Notes (Signed)
Subjective:    Patient ID: Ivan West, male    DOB: 1930-09-07, 84 y.o.   MRN: [id number]  HPI Pt here for follow up and management of chronic medical problems which includes hypertension, hyperlipidemia, and hypothyroid. He is taking medication regularly.  The patient is doing well overall and his only complaint today is a mole on his right hip.  He will get lab work today.  He has had prostate cancer.  He also has gout and heart disease.  He has had stools that were positive for blood.  Patient is pleasant and alert as usual.  He still is followed regularly by different specialist including cardiology urology nephrology and gastroenterology.  He sees a nephrologist next week and this is an every 40-monthvisit.  He has not been able to see his rheumatologist because he retired and they have torn the building down and we will try to help find out where he is supposed to go back to be seen for this.  The patient today denies any chest pain pressure tightness or shortness of breath.  He denies any trouble with swallowing nausea vomiting diarrhea blood in the stool or black tarry bowel movements.  He denies any trouble with his umbilical hernia.  He is passing his water well without problems.  He did see the gastroenterologist and gastroenterologist decided to wait on doing the colonoscopy and we certainly agree with what ever decision was made regarding this.  We told the patient we would monitor the CBC see periodically make sure that his hemoglobin remains stable and he seems to be happy with this.  Patient Active Problem List   Diagnosis Date Noted  . Primary gout 01/22/2015  . CKD (chronic kidney disease) 01/22/2015  . Vitamin D deficiency 01/22/2015  . Hypothyroidism 07/08/2013  . Hyperlipidemia 07/08/2013  . Elevated PSA 07/08/2013  . Prostate cancer ([facility] 07/08/2013  . CAD (coronary artery disease) 07/08/2013  . Benign hypertension with CKD (chronic kidney disease) stage IV [place]  01/29/2013  . Elevated serum creatinine 01/29/2013  . Gout 01/29/2013   Outpatient Encounter Medications as of 06/13/2018  Medication Sig  . allopurinol (ZYLOPRIM) 100 MG tablet Take 1 tablet by mouth 2 (two) times daily.  .Marland KitchenamLODipine (NORVASC) 10 MG tablet TAKE (1) TABLET DAILY  . aspirin 81 MG tablet Take 81 mg by mouth daily.  . carvedilol (COREG) 12.5 MG tablet Take 12.5 mg by mouth 2 (two) times daily with a meal.  . fish oil-omega-3 fatty acids 1000 MG capsule Take 1 g by mouth daily.  . furosemide (LASIX) 20 MG tablet TAKE 1 TABLET AS DIRECTED AS NEEDED.  .Marland Kitchenlevothyroxine (SYNTHROID, LEVOTHROID) 75 MCG tablet TAKE 1 TABLET BY MOUTH DAILY  . isosorbide mononitrate (IMDUR) 30 MG 24 hr tablet Take 1 tablet by mouth at bedtime.  . [DISCONTINUED] febuxostat (ULORIC) 40 MG tablet Take 80 mg by mouth daily.  . [DISCONTINUED] febuxostat (ULORIC) 40 MG tablet Take 2 tablets (80 mg total) by mouth daily.  . [DISCONTINUED] levothyroxine (SYNTHROID, LEVOTHROID) 75 MCG tablet TAKE 1 TABLET BY MOUTH DAILY   No facility-administered encounter medications on file as of 06/13/2018.      Review of Systems  Constitutional: Negative.   HENT: Negative.   Eyes: Negative.   Respiratory: Negative.   Cardiovascular: Negative.   Gastrointestinal: Negative.   Endocrine: Negative.   Genitourinary: Negative.   Musculoskeletal: Negative.   Skin: Negative.        Right hip area -  mole   Allergic/Immunologic: Negative.   Neurological: Negative.   Hematological: Negative.   Psychiatric/Behavioral: Negative.        Objective:   Physical Exam  Constitutional: He is oriented to person, place, and time. He appears well-developed and well-nourished. No distress.  Patient is pleasant and alert as usual.  He appears and acts much younger than his stated age of 85 years.  HENT:  Head: Normocephalic and atraumatic.  Right Ear: External ear normal.  Left Ear: External ear normal.  Nose: Nose normal.    Mouth/Throat: Oropharynx is clear and moist. No oropharyngeal exudate.  Eyes: Pupils are equal, round, and reactive to light. Conjunctivae and EOM are normal. Right eye exhibits no discharge. Left eye exhibits no discharge. No scleral icterus.  Recent eye exam with change in glasses.  Neck: Normal range of motion. Neck supple. No thyromegaly present.  No bruits thyromegaly or anterior cervical adenopathy.  Cardiovascular: Normal rate, regular rhythm, normal heart sounds and intact distal pulses.  No murmur heard. Heart is regular at 72/min with pedal pulses that are present but difficult to find especially with the pedal edema.  Pulmonary/Chest: Effort normal and breath sounds normal. No respiratory distress. He has no wheezes. He has no rales.  Clear anteriorly and posteriorly no axillary adenopathy or chest wall masses.  Abdominal: Soft. Bowel sounds are normal. He exhibits no mass. There is no tenderness.  Umbilical hernia persists.  There is no liver or spleen enlargement.  There is no epigastric tenderness masses or bruits with good inguinal pulses and without inguinal adenopathy.  Genitourinary:  Genitourinary Comments: She sees a urologist regularly.  Musculoskeletal: Normal range of motion. He exhibits edema. He exhibits no tenderness.  There is some bilateral pedal edema today without tenderness to joints and palpation.  There is good mobility.  Patient denies any recent increase in sodium intake or foods that are high sodium related.  Lymphadenopathy:    He has no cervical adenopathy.  Neurological: He is alert and oriented to person, place, and time. He has normal reflexes. No cranial nerve deficit.  Skin: Skin is warm and dry. No rash noted. No erythema.  Dry scaly lesion right lateral buttock.  Cryotherapy was performed without problems.  Psychiatric: He has a normal mood and affect. His behavior is normal. Judgment and thought content normal.  Mood affect and behavior are all  normal for this patient.  Nursing note and vitals reviewed.   BP 134/75 (BP Location: Left Arm)   Pulse 68   Temp (!) 96.7 F (35.9 C) (Oral)   Ht 5' 11.5" (1.816 m)   Wt 177 lb (80.3 kg)   BMI 24.34 kg/m   Cryotherapy performed to dry scaly lesion on right buttock without problems.       Assessment & Plan:  1. Essential hypertension -The blood pressure is good today and patient will continue with current treatment and follow-up with nephrologist as planned - BMP8+EGFR - CBC with Differential/Platelet - Hepatic function panel  2. Pure hypercholesterolemia -Continue with aggressive therapeutic lifestyle changes and omega-3 fatty acids - CBC with Differential/Platelet - Lipid panel  3. Prostate cancer [facility]) -Follow-up with urology as planned - CBC with Differential/Platelet  4. Vitamin D deficiency -Because of renal function patient is not replacing vitamin D currently. - CBC with Differential/Platelet - VITAMIN D 25 Hydroxy (Vit-D Deficiency, Fractures)  5. Hypothyroidism, unspecified type - CBC with Differential/Platelet - Thyroid Panel With TSH  6. Chronic kidney disease (CKD) stage G3b/A1, moderately  decreased glomerular filtration rate (GFR) between 30-44 mL/min/1.73 square meter and albuminuria creatinine ratio less than 30 mg/g (HCC) -Follow-up with nephrology as planned -Increase furosemide to 40 mg daily until the visit with the nephrologist next week and she may want to repeat the BMP at that time.  Your weight in the office today was 177 and previously the weight was 169. - BMP8+EGFR - CBC with Differential/Platelet  7. Idiopathic gout, unspecified chronicity, unspecified site -Arrange visit with rheumatologist - CBC with Differential/Platelet - Uric acid  8. Irritated nevus of lower back -Cryotherapy performed to lesion of right buttock.  9. Umbilical hernia without obstruction and without gangrene -This is been with the patient for a long time he  understands if he develops any pain should get to the emergency room as soon as possible  10. Peripheral vascular insufficiency (HCC) -Pedal pulses were present but harder to locate because of pedal edema  11. Fecal occult blood test positive -Patient did see the gastroenterologist but the decision was made between the gastroenterologist and the patient to continue to monitor the blood in the stool and the hemoglobin and if any significant changes the possibility of a colonoscopy would be readdressed at that time.   Patient Instructions                       Medicare Annual Wellness Visit  [facility] and the medical providers at [facility] strive to bring you the best medical care.  In doing so we not only want to address your current medical conditions and concerns but also to detect new conditions early and prevent illness, disease and health-related problems.    Medicare offers a yearly Wellness Visit which allows our clinical staff to assess your need for preventative services including immunizations, lifestyle education, counseling to decrease risk of preventable diseases and screening for fall risk and other medical concerns.    This visit is provided free of charge (no copay) for all Medicare recipients. The clinical pharmacists at [facility] have begun to conduct these Wellness Visits which will also include a thorough review of all your medications.    As you primary medical provider recommend that you make an appointment for your Annual Wellness Visit if you have not done so already this year.  You may set up this appointment before you leave today or you may call back ([phone]) and schedule an appointment.  Please make sure when you call that you mention that you are scheduling your Annual Wellness Visit with the clinical pharmacist so that the appointment may be made for the proper length of time.     Continue current  medications. Continue good therapeutic lifestyle changes which include good diet and exercise. Fall precautions discussed with patient. If an FOBT was given today- please return it to our front desk. If you are over 94 years old - you may need Prevnar 23 or the adult Pneumonia vaccine.  **Flu shots are available--- please call and schedule a FLU-CLINIC appointment**  After your visit with [place] today you will receive a survey in the mail or online from [employer] regarding your care with [place]. Please take a moment to fill this out. Your feedback is very important to [place] as you can help [place] better understand your patient needs as well as improve your experience and satisfaction. WE CARE ABOUT YOU!!!   Continue to follow-up regularly with cardiology urology nephrology.  Follow-up with gastroenterology as  needed and with rheumatology once we find out who the new doctor is and where they are located. Continue to drink plenty of fluids and stay well-hydrated Instead of taking Zantac, you may take Pepcid AC as needed for heartburn and indigestion just at the same frequency that you would take the Zantac. According to the computer you are currently taking furosemide 20 mg 1 daily as directed.  We would ask that you take this to daily at least until you see the nephrologist next week and she may want to at that time check another BMP and make sure that your weight is down since it was up from 1 69-1 77 today with the pedal edema.   Arrie Senate MD

## 2018-06-14 LAB — THYROID PANEL WITH TSH
Free Thyroxine Index: 2.4 (ref 1.2–4.9)
T3 UPTAKE RATIO: 31 % (ref 24–39)
T4, Total: 7.7 ug/dL (ref 4.5–12.0)
TSH: 0.652 u[IU]/mL (ref 0.450–4.500)

## 2018-06-14 LAB — LIPID PANEL
Chol/HDL Ratio: 2.9 ratio (ref 0.0–5.0)
Cholesterol, Total: 163 mg/dL (ref 100–199)
HDL: 57 mg/dL (ref >39)
LDL Calculated: 89 mg/dL (ref 0–99)
TRIGLYCERIDES: 87 mg/dL (ref 0–149)
VLDL Cholesterol Cal: 17 mg/dL (ref 5–40)

## 2018-06-14 LAB — HEPATIC FUNCTION PANEL
ALT: 15 IU/L (ref 0–44)
AST: 19 IU/L (ref 0–40)
Albumin: 4.1 g/dL (ref 3.5–4.7)
Alkaline Phosphatase: 79 IU/L (ref 39–117)
BILIRUBIN TOTAL: 0.6 mg/dL (ref 0.0–1.2)
BILIRUBIN, DIRECT: 0.16 mg/dL (ref 0.00–0.40)
Total Protein: 6.6 g/dL (ref 6.0–8.5)

## 2018-06-14 LAB — CBC WITH DIFFERENTIAL/PLATELET
Basophils Absolute: 0 10*3/uL (ref 0.0–0.2)
Basos: 1 %
EOS (ABSOLUTE): 0.2 10*3/uL (ref 0.0–0.4)
Eos: 5 %
Hematocrit: 41.8 % (ref 37.5–51.0)
Hemoglobin: 13.1 g/dL (ref 13.0–17.7)
Immature Grans (Abs): 0 10*3/uL (ref 0.0–0.1)
Immature Granulocytes: 0 %
LYMPHS ABS: 1.1 10*3/uL (ref 0.7–3.1)
Lymphs: 30 %
MCH: 26.5 pg — ABNORMAL LOW (ref 26.6–33.0)
MCHC: 31.3 g/dL — AB (ref 31.5–35.7)
MCV: 85 fL (ref 79–97)
MONOCYTES: 14 %
Monocytes Absolute: 0.5 10*3/uL (ref 0.1–0.9)
Neutrophils Absolute: 1.8 10*3/uL (ref 1.4–7.0)
Neutrophils: 50 %
Platelets: 178 10*3/uL (ref 150–450)
RBC: 4.94 x10E6/uL (ref 4.14–5.80)
RDW: 14.5 % (ref 12.3–15.4)
WBC: 3.7 10*3/uL (ref 3.4–10.8)

## 2018-06-14 LAB — BMP8+EGFR
BUN / CREAT RATIO: 11 (ref 10–24)
BUN: 25 mg/dL (ref 8–27)
CO2: 22 mmol/L (ref 20–29)
Calcium: 10.1 mg/dL (ref 8.6–10.2)
Chloride: 105 mmol/L (ref 96–106)
Creatinine, Ser: 2.3 mg/dL — ABNORMAL HIGH (ref 0.76–1.27)
GFR calc non Af Amer: 25 mL/min/{1.73_m2} — ABNORMAL LOW (ref >59)
GFR, EST AFRICAN AMERICAN: 29 mL/min/{1.73_m2} — AB (ref >59)
GLUCOSE: 106 mg/dL — AB (ref 65–99)
Potassium: 5.1 mmol/L (ref 3.5–5.2)
SODIUM: 141 mmol/L (ref 134–144)

## 2018-06-14 LAB — URIC ACID: URIC ACID: 4.1 mg/dL (ref 3.7–8.6)

## 2018-06-14 LAB — VITAMIN D 25 HYDROXY (VIT D DEFICIENCY, FRACTURES): Vit D, 25-Hydroxy: 11.2 ng/mL — ABNORMAL LOW (ref 30.0–100.0)

## 2018-06-15 ENCOUNTER — Telehealth: Payer: Self-pay | Admitting: Family Medicine

## 2018-06-15 NOTE — Telephone Encounter (Signed)
Aware - labs reviewed

## 2018-06-20 DIAGNOSIS — N184 Chronic kidney disease, stage 4 (severe): Secondary | ICD-10-CM | POA: Diagnosis not present

## 2018-06-20 DIAGNOSIS — I10 Essential (primary) hypertension: Secondary | ICD-10-CM | POA: Diagnosis not present

## 2018-07-23 DIAGNOSIS — Z45010 Encounter for checking and testing of cardiac pacemaker pulse generator [battery]: Secondary | ICD-10-CM | POA: Diagnosis not present

## 2018-07-23 DIAGNOSIS — Z45018 Encounter for adjustment and management of other part of cardiac pacemaker: Secondary | ICD-10-CM | POA: Diagnosis not present

## 2018-10-23 ENCOUNTER — Encounter: Payer: Self-pay | Admitting: Family Medicine

## 2018-10-23 ENCOUNTER — Ambulatory Visit: Payer: Medicare Other | Admitting: Family Medicine

## 2018-10-23 VITALS — BP 137/71 | HR 49 | Temp 96.7°F | Ht 71.5 in | Wt 174.0 lb

## 2018-10-23 DIAGNOSIS — E559 Vitamin D deficiency, unspecified: Secondary | ICD-10-CM | POA: Diagnosis not present

## 2018-10-23 DIAGNOSIS — I129 Hypertensive chronic kidney disease with stage 1 through stage 4 chronic kidney disease, or unspecified chronic kidney disease: Secondary | ICD-10-CM

## 2018-10-23 DIAGNOSIS — C61 Malignant neoplasm of prostate: Secondary | ICD-10-CM | POA: Diagnosis not present

## 2018-10-23 DIAGNOSIS — R600 Localized edema: Secondary | ICD-10-CM

## 2018-10-23 DIAGNOSIS — H8303 Labyrinthitis, bilateral: Secondary | ICD-10-CM

## 2018-10-23 DIAGNOSIS — N183 Chronic kidney disease, stage 3 (moderate): Secondary | ICD-10-CM | POA: Diagnosis not present

## 2018-10-23 DIAGNOSIS — N184 Chronic kidney disease, stage 4 (severe): Secondary | ICD-10-CM

## 2018-10-23 DIAGNOSIS — I10 Essential (primary) hypertension: Secondary | ICD-10-CM

## 2018-10-23 DIAGNOSIS — N1832 Chronic kidney disease, stage 3b: Secondary | ICD-10-CM

## 2018-10-23 DIAGNOSIS — M1000 Idiopathic gout, unspecified site: Secondary | ICD-10-CM

## 2018-10-23 DIAGNOSIS — E7800 Pure hypercholesterolemia, unspecified: Secondary | ICD-10-CM

## 2018-10-23 DIAGNOSIS — I739 Peripheral vascular disease, unspecified: Secondary | ICD-10-CM

## 2018-10-23 DIAGNOSIS — K429 Umbilical hernia without obstruction or gangrene: Secondary | ICD-10-CM

## 2018-10-23 DIAGNOSIS — E039 Hypothyroidism, unspecified: Secondary | ICD-10-CM

## 2018-10-23 MED ORDER — MECLIZINE HCL 12.5 MG PO TABS: 12.5000 mg | ORAL_TABLET | Freq: Three times a day (TID) | ORAL | 0 refills | Status: AC | PRN

## 2018-10-23 NOTE — Progress Notes (Addendum)
Subjective:    Patient ID: Ivan West, male    DOB: [birth year], 85 y.o.   MRN: [id number]  HPI Pt here for follow up and management of chronic medical problems which includes hypertension and hyperlipidemia. He is taking medication regularly.  Patient has several complaints today.  He is complaining with some dizziness and some pain and burning on his back but no skin blisters or rashes.  He is also complaining with some swelling in his feet.  He is followed regularly by the rheumatologist for his gout and by the nephrologist for his chronic kidney disease and the urologist, Dr. Ree Edman for his prostate cancer.  His cardiologist is Dr. Zenia Resides.  His nephrologist is Dr. Wyvonnia Lora.  Dr. Esperanza Sheets has retired and he is seeing another rheumatologist for his gout issues.  He is currently taking thyroid replacement amlodipine and carvedilol and allopurinol.  He is on Lasix 20.  The patient's weight today is actually down 3 pounds from the previous check.  Patient is pleasant and doing well.  He has had some dizziness over the past 2 or 3 days that comes with certain head movements.  We did discuss fabric softener soaps and detergents and he should be using scent free soaps fabric softeners and detergents.  He also has had some swelling in his feet and the left is worse than the right and he does admit to eating a lot of canned goods.  He will try to reduce his sodium intake more.  We will have him wear some support stockings.  He does not see the rheumatologist anymore and we will make sure we get a uric acid here in the office today.  He denies any chest pain or shortness of breath or PND.  He does have occasional trouble swallowing and this is mostly water and no foods.  He has had no change in bowel habits and no blood in the stool.  He is passing his water well.  He just had his eye exam.    Patient Active Problem List   Diagnosis Date Noted  . Primary gout 01/22/2015  . CKD (chronic kidney disease)  01/22/2015  . Vitamin D deficiency 01/22/2015  . Hypothyroidism 07/08/2013  . Hyperlipidemia 07/08/2013  . Elevated PSA 07/08/2013  . Prostate cancer ([place]) 07/08/2013  . CAD (coronary artery disease) 07/08/2013  . Benign hypertension with CKD (chronic kidney disease) stage IV ([place]) 01/29/2013  . Elevated serum creatinine 01/29/2013  . Gout 01/29/2013   Outpatient Encounter Medications as of 10/23/2018  Medication Sig  . allopurinol (ZYLOPRIM) 100 MG tablet Take 1 tablet by mouth 2 (two) times daily.  Marland Kitchen amLODipine (NORVASC) 10 MG tablet TAKE (1) TABLET DAILY  . aspirin 81 MG tablet Take 81 mg by mouth daily.  . carvedilol (COREG) 12.5 MG tablet Take 12.5 mg by mouth 2 (two) times daily with a meal.  . fish oil-omega-3 fatty acids 1000 MG capsule Take 1 g by mouth daily.  . furosemide (LASIX) 20 MG tablet TAKE 1 TABLET AS DIRECTED AS NEEDED.  Marland Kitchen isosorbide mononitrate (IMDUR) 30 MG 24 hr tablet Take 1 tablet by mouth at bedtime.  Marland Kitchen levothyroxine (SYNTHROID, LEVOTHROID) 75 MCG tablet TAKE 1 TABLET BY MOUTH DAILY   No facility-administered encounter medications on file as of 10/23/2018.       Review of Systems  Constitutional: Negative.   HENT: Negative.   Eyes: Negative.   Respiratory: Negative.   Cardiovascular: Positive for leg swelling (bilateral foot swelling ).  Gastrointestinal: Negative.   Endocrine: Negative.   Genitourinary: Negative.   Musculoskeletal: Negative.   Skin: Negative.        Back = skin burning sensation - no blisters   Allergic/Immunologic: Negative.   Neurological: Positive for dizziness.  Hematological: Negative.   Psychiatric/Behavioral: Negative.        Objective:   Physical Exam Vitals signs and nursing note reviewed.  Constitutional:      General: He is not in acute distress.    Appearance: Normal appearance. He is well-developed. He is not ill-appearing.     Comments: The patient is pleasant and alert with a few more complaints today than  usual.  HENT:     Head: Normocephalic and atraumatic.     Right Ear: External ear normal. There is impacted cerumen.     Left Ear: External ear normal. There is impacted cerumen.     Ears:     Comments: Ear canals today could not be well visualized because of some cerumen in each ear canal.  The patient likes to use Debrox drops at home and did not want me to wash the ear canals out.    Nose: Nose normal. No congestion or rhinorrhea.     Mouth/Throat:     Mouth: Mucous membranes are moist.     Pharynx: Oropharynx is clear. No oropharyngeal exudate or posterior oropharyngeal erythema.  Eyes:     General: No scleral icterus.       Right eye: No discharge.        Left eye: No discharge.     Extraocular Movements: Extraocular movements intact.     Conjunctiva/sclera: Conjunctivae normal.     Pupils: Pupils are equal, round, and reactive to light.     Comments: Patient is up-to-date on eye exams.  Neck:     Musculoskeletal: Normal range of motion and neck supple.     Thyroid: No thyromegaly.     Vascular: No carotid bruit.     Trachea: No tracheal deviation.     Comments: No bruits thyromegaly or anterior cervical adenopathy Cardiovascular:     Rate and Rhythm: Normal rate and regular rhythm.     Heart sounds: Normal heart sounds. No murmur.     Comments: The heart is regular at 60/min there is some pedal edema bilaterally and pulses were more difficult to find on the right side than the left. Pulmonary:     Effort: Pulmonary effort is normal. No respiratory distress.     Breath sounds: Normal breath sounds. No wheezing or rales.     Comments: Good breath sounds anteriorly and posteriorly no axillary adenopathy or chest wall masses. Chest:     Chest wall: No tenderness.  Abdominal:     General: Bowel sounds are normal.     Palpations: Abdomen is soft. There is no mass.     Tenderness: There is no abdominal tenderness. There is no guarding or rebound.     Comments: No liver or spleen  enlargement no epigastric tenderness and good inguinal pulses without adenopathy.  Musculoskeletal: Normal range of motion.        General: No tenderness.     Right lower leg: Edema present.     Left lower leg: Edema present.  Lymphadenopathy:     Cervical: No cervical adenopathy.  Skin:    General: Skin is warm and dry.     Findings: No rash.  Neurological:     General: No focal deficit present.  Mental Status: He is alert and oriented to person, place, and time. Mental status is at baseline.     Cranial Nerves: No cranial nerve deficit.     Gait: Gait normal.     Deep Tendon Reflexes: Reflexes are normal and symmetric. Reflexes normal.  Psychiatric:        Mood and Affect: Mood normal.        Behavior: Behavior normal.        Thought Content: Thought content normal.        Judgment: Judgment normal.     Comments: Mood affect and behavior for this patient are normal.    BP 137/71 (BP Location: Left Arm)   Pulse (!) 49   Temp (!) 96.7 F (35.9 C) (Oral)   Ht 5' 11.5" (1.816 m)   Wt 174 lb (78.9 kg)   BMI 23.93 kg/m         Assessment & Plan:  1. Essential hypertension -The blood pressure is good today and he will continue with current treatment -The patient should make a special effort to watch his sodium intake and especially being aware of products he buys at the grocery store and canned foods having more sodium than fresh and frozen foods. - BMP8+EGFR - CBC with Differential/Platelet - Hepatic function panel - DME Other see comment  2. Pure hypercholesterolemia -Continue with current treatment and with as aggressive therapeutic lifestyle changes as possible including diet and exercise to achieve weight loss. - CBC with Differential/Platelet - Lipid panel  3. Prostate cancer [facility]) -Follow-up with urology as planned - CBC with Differential/Platelet  4. Vitamin D deficiency - CBC with Differential/Platelet - VITAMIN D 25 Hydroxy (Vit-D Deficiency,  Fractures)  5. Hypothyroidism, unspecified type -Continue with current treatment pending results of lab work - CBC with Differential/Platelet - Thyroid Panel With TSH  6. Idiopathic gout, unspecified chronicity, unspecified site - CBC with Differential/Platelet - Uric acid  7. Chronic kidney disease (CKD) stage G3b/A1, moderately decreased glomerular filtration rate (GFR) between 30-44 mL/min/1.73 square meter and albuminuria creatinine ratio less than 30 mg/g (HCC) -Follow-up with nephrology as planned - BMP8+EGFR - CBC with Differential/Platelet  8. Labyrinthitis of both ears -Drink fluids and prescription given for meclizine if dizziness especially with movement persist.  9. Umbilical hernia without obstruction and without gangrene -No complaints with this.  10.  Bilateral lower extremity edema without weight gain -Wear support hose as directed and these should be placed on both lower extremities the first thing with getting out of the bed in the morning.  11.  Vascular insufficiency right greater than left  12.  Difficulty swallowing water at times -Keep [place] posted about this and if this problem gets worse let us know about this.  13.  Chronic kidney disease with hypertension -Continue to follow-up with nephrology  Meds ordered this encounter  Medications  . meclizine (ANTIVERT) 12.5 MG tablet    Sig: Take 1 tablet (12.5 mg total) by mouth 3 (three) times daily as needed for dizziness.    Dispense:  30 tablet    Refill:  0   Patient Instructions                       Medicare Annual Wellness Visit  [facility] and the medical providers at [facility] strive to bring you the best medical care.  In doing so we not only want to address your current medical conditions and concerns but also to detect  new conditions early and prevent illness, disease and health-related problems.    Medicare offers a yearly Wellness Visit which allows our clinical staff  to assess your need for preventative services including immunizations, lifestyle education, counseling to decrease risk of preventable diseases and screening for fall risk and other medical concerns.    This visit is provided free of charge (no copay) for all Medicare recipients. The clinical pharmacists at [facility] have begun to conduct these Wellness Visits which will also include a thorough review of all your medications.    As you primary medical provider recommend that you make an appointment for your Annual Wellness Visit if you have not done so already this year.  You may set up this appointment before you leave today or you may call back ([phone]) and schedule an appointment.  Please make sure when you call that you mention that you are scheduling your Annual Wellness Visit with the clinical pharmacist so that the appointment may be made for the proper length of time.     Continue current medications. Continue good therapeutic lifestyle changes which include good diet and exercise. Fall precautions discussed with patient. If an FOBT was given today- please return it to our front desk. If you are over 34 years old - you may need Prevnar 42 or the adult Pneumonia vaccine.  **Flu shots are available--- please call and schedule a FLU-CLINIC appointment**  After your visit with [place] today you will receive a survey in the mail or online from [employer] regarding your care with [place]. Please take a moment to fill this out. Your feedback is very important to [place] as you can help [place] better understand your patient needs as well as improve your experience and satisfaction. WE CARE ABOUT YOU!!!   The patient should continue to follow-up with his nephrologist, urologist, and cardiologist as per their request. Watch sodium intake Remember that canned foods have a lot of sodium in them When you shop for groceries check sodium content Use soaps fabric softeners and detergents that  are scent free Eucerin or Moisturel or good moisturizers to have your wife put on your back Check with your pharmacy and if possible get some good commercial support stockings that are calf length.  This should be put on the first thing with getting out of the bed in the morning. Continue to drink plenty of fluids, especially water If your dizziness does not get better we will give you a handwritten prescription for meclizine 12.5 mg 3 times daily with food for 5 to 7 days. If swallowing problems progressed the patient should get back in touch with [place].  Arrie Senate MD

## 2018-10-23 NOTE — Patient Instructions (Addendum)
Medicare Annual Wellness Visit  [facility] and the medical providers at [facility] strive to bring you the best medical care.  In doing so we not only want to address your current medical conditions and concerns but also to detect new conditions early and prevent illness, disease and health-related problems.    Medicare offers a yearly Wellness Visit which allows our clinical staff to assess your need for preventative services including immunizations, lifestyle education, counseling to decrease risk of preventable diseases and screening for fall risk and other medical concerns.    This visit is provided free of charge (no copay) for all Medicare recipients. The clinical pharmacists at [facility] have begun to conduct these Wellness Visits which will also include a thorough review of all your medications.    As you primary medical provider recommend that you make an appointment for your Annual Wellness Visit if you have not done so already this year.  You may set up this appointment before you leave today or you may call back ([phone]) and schedule an appointment.  Please make sure when you call that you mention that you are scheduling your Annual Wellness Visit with the clinical pharmacist so that the appointment may be made for the proper length of time.     Continue current medications. Continue good therapeutic lifestyle changes which include good diet and exercise. Fall precautions discussed with patient. If an FOBT was given today- please return it to our front desk. If you are over 76 years old - you may need Prevnar 53 or the adult Pneumonia vaccine.  **Flu shots are available--- please call and schedule a FLU-CLINIC appointment**  After your visit with [place] today you will receive a survey in the mail or online from [employer] regarding your care with [place]. Please take a moment to fill this out. Your feedback is very  important to [place] as you can help [place] better understand your patient needs as well as improve your experience and satisfaction. WE CARE ABOUT YOU!!!   The patient should continue to follow-up with his nephrologist, urologist, and cardiologist as per their request. Watch sodium intake Remember that canned foods have a lot of sodium in them When you shop for groceries check sodium content Use soaps fabric softeners and detergents that are scent free Eucerin or Moisturel or good moisturizers to have your wife put on your back Check with your pharmacy and if possible get some good commercial support stockings that are calf length.  This should be put on the first thing with getting out of the bed in the morning. Continue to drink plenty of fluids, especially water If your dizziness does not get better we will give you a handwritten prescription for meclizine 12.5 mg 3 times daily with food for 5 to 7 days. If swallowing problems progressed the patient should get back in touch with [place].

## 2018-10-24 DIAGNOSIS — I442 Atrioventricular block, complete: Secondary | ICD-10-CM | POA: Diagnosis not present

## 2018-10-24 DIAGNOSIS — Z950 Presence of cardiac pacemaker: Secondary | ICD-10-CM | POA: Diagnosis not present

## 2018-10-24 LAB — BMP8+EGFR
BUN/Creatinine Ratio: 12 (ref 10–24)
BUN: 37 mg/dL — ABNORMAL HIGH (ref 8–27)
CO2: 21 mmol/L (ref 20–29)
CREATININE: 3 mg/dL — AB (ref 0.76–1.27)
Calcium: 10.4 mg/dL — ABNORMAL HIGH (ref 8.6–10.2)
Chloride: 105 mmol/L (ref 96–106)
GFR, EST AFRICAN AMERICAN: 21 mL/min/{1.73_m2} — AB (ref >59)
GFR, EST NON AFRICAN AMERICAN: 18 mL/min/{1.73_m2} — AB (ref >59)
Glucose: 105 mg/dL — ABNORMAL HIGH (ref 65–99)
Potassium: 4.6 mmol/L (ref 3.5–5.2)
SODIUM: 143 mmol/L (ref 134–144)

## 2018-10-24 LAB — CBC WITH DIFFERENTIAL/PLATELET
BASOS ABS: 0.1 10*3/uL (ref 0.0–0.2)
BASOS: 2 %
EOS (ABSOLUTE): 0.2 10*3/uL (ref 0.0–0.4)
EOS: 6 %
Hematocrit: 40.9 % (ref 37.5–51.0)
Hemoglobin: 13.2 g/dL (ref 13.0–17.7)
Immature Grans (Abs): 0 10*3/uL (ref 0.0–0.1)
Immature Granulocytes: 0 %
LYMPHS ABS: 1.3 10*3/uL (ref 0.7–3.1)
LYMPHS: 33 %
MCH: 27.4 pg (ref 26.6–33.0)
MCHC: 32.3 g/dL (ref 31.5–35.7)
MCV: 85 fL (ref 79–97)
Monocytes Absolute: 0.4 10*3/uL (ref 0.1–0.9)
Monocytes: 12 %
NEUTROS ABS: 1.8 10*3/uL (ref 1.4–7.0)
NEUTROS PCT: 47 %
PLATELETS: 179 10*3/uL (ref 150–450)
RBC: 4.82 x10E6/uL (ref 4.14–5.80)
RDW: 14.6 % (ref 11.6–15.4)
WBC: 3.8 10*3/uL (ref 3.4–10.8)

## 2018-10-24 LAB — HEPATIC FUNCTION PANEL
ALT: 14 IU/L (ref 0–44)
AST: 14 IU/L (ref 0–40)
Albumin: 3.8 g/dL (ref 3.6–4.6)
Alkaline Phosphatase: 85 IU/L (ref 39–117)
BILIRUBIN TOTAL: 0.6 mg/dL (ref 0.0–1.2)
Bilirubin, Direct: 0.17 mg/dL (ref 0.00–0.40)
Total Protein: 6.4 g/dL (ref 6.0–8.5)

## 2018-10-24 LAB — LIPID PANEL
Chol/HDL Ratio: 3.1 ratio (ref 0.0–5.0)
Cholesterol, Total: 173 mg/dL (ref 100–199)
HDL: 55 mg/dL (ref >39)
LDL Calculated: 97 mg/dL (ref 0–99)
Triglycerides: 105 mg/dL (ref 0–149)
VLDL Cholesterol Cal: 21 mg/dL (ref 5–40)

## 2018-10-24 LAB — THYROID PANEL WITH TSH
FREE THYROXINE INDEX: 2.4 (ref 1.2–4.9)
T3 Uptake Ratio: 32 % (ref 24–39)
T4, Total: 7.4 ug/dL (ref 4.5–12.0)
TSH: 0.76 u[IU]/mL (ref 0.450–4.500)

## 2018-10-24 LAB — VITAMIN D 25 HYDROXY (VIT D DEFICIENCY, FRACTURES): Vit D, 25-Hydroxy: 9.2 ng/mL — ABNORMAL LOW (ref 30.0–100.0)

## 2018-10-24 LAB — URIC ACID: Uric Acid: 4 mg/dL (ref 3.7–8.6)

## 2018-10-25 ENCOUNTER — Telehealth: Payer: Self-pay | Admitting: Family Medicine

## 2018-10-25 DIAGNOSIS — R7989 Other specified abnormal findings of blood chemistry: Secondary | ICD-10-CM

## 2018-10-25 NOTE — Telephone Encounter (Signed)
Message was took earlier this morning but due to Epic outage message is being routed to pools now  Rtn call for lab results

## 2018-10-25 NOTE — Telephone Encounter (Signed)
LMTCB

## 2018-10-26 ENCOUNTER — Other Ambulatory Visit: Payer: Self-pay | Admitting: *Deleted

## 2018-10-26 ENCOUNTER — Telehealth: Payer: Self-pay | Admitting: Family Medicine

## 2018-10-26 DIAGNOSIS — R7989 Other specified abnormal findings of blood chemistry: Secondary | ICD-10-CM

## 2018-10-26 NOTE — Telephone Encounter (Signed)
Pt aware.

## 2018-11-01 NOTE — Telephone Encounter (Signed)
Pt aware of lab results on 10/26/2018 by Georgina Pillion

## 2018-11-14 DIAGNOSIS — N184 Chronic kidney disease, stage 4 (severe): Secondary | ICD-10-CM | POA: Diagnosis not present

## 2018-12-05 ENCOUNTER — Ambulatory Visit: Payer: Medicare Other | Admitting: *Deleted

## 2018-12-11 ENCOUNTER — Telehealth: Payer: Self-pay | Admitting: Family Medicine

## 2019-01-08 ENCOUNTER — Telehealth: Payer: Self-pay | Admitting: Family Medicine

## 2019-01-21 ENCOUNTER — Other Ambulatory Visit: Payer: Self-pay | Admitting: Family Medicine

## 2019-01-24 DIAGNOSIS — I442 Atrioventricular block, complete: Secondary | ICD-10-CM | POA: Diagnosis not present

## 2019-01-24 DIAGNOSIS — Z950 Presence of cardiac pacemaker: Secondary | ICD-10-CM | POA: Diagnosis not present

## 2019-02-28 DIAGNOSIS — N184 Chronic kidney disease, stage 4 (severe): Secondary | ICD-10-CM | POA: Diagnosis not present

## 2019-02-28 DIAGNOSIS — D631 Anemia in chronic kidney disease: Secondary | ICD-10-CM | POA: Diagnosis not present

## 2019-03-08 ENCOUNTER — Ambulatory Visit: Payer: Medicare Other | Admitting: Family Medicine

## 2019-04-23 ENCOUNTER — Ambulatory Visit: Payer: Medicare Other

## 2019-04-23 DIAGNOSIS — M109 Gout, unspecified: Secondary | ICD-10-CM | POA: Diagnosis not present

## 2019-04-23 DIAGNOSIS — I442 Atrioventricular block, complete: Secondary | ICD-10-CM | POA: Diagnosis not present

## 2019-04-23 DIAGNOSIS — Z8546 Personal history of malignant neoplasm of prostate: Secondary | ICD-10-CM | POA: Diagnosis not present

## 2019-04-23 DIAGNOSIS — N183 Chronic kidney disease, stage 3 (moderate): Secondary | ICD-10-CM | POA: Diagnosis not present

## 2019-04-23 DIAGNOSIS — I10 Essential (primary) hypertension: Secondary | ICD-10-CM | POA: Diagnosis not present

## 2019-04-23 DIAGNOSIS — E039 Hypothyroidism, unspecified: Secondary | ICD-10-CM | POA: Diagnosis not present

## 2019-10-21 ENCOUNTER — Other Ambulatory Visit: Payer: Self-pay | Admitting: Family Medicine

## 2019-11-18 ENCOUNTER — Other Ambulatory Visit: Payer: Self-pay | Admitting: Family Medicine

## 2019-12-17 ENCOUNTER — Other Ambulatory Visit: Payer: Self-pay | Admitting: Family Medicine

## 2019-12-17 NOTE — Telephone Encounter (Signed)
Former [employer]. NTBS LOV 10/23/18

## 2019-12-17 NOTE — Telephone Encounter (Signed)
Left message to call back to schedule with new pcp for medication refill.

## 2019-12-18 ENCOUNTER — Telehealth: Payer: Self-pay | Admitting: General Practice

## 2019-12-18 NOTE — Telephone Encounter (Signed)
FYI message, no longer a patient at this practice

## 2019-12-19 IMAGING — DX DG CHEST 2V
2 series · 2 of 2 positions shown · non-contrast
Comparison: 10/26/2015

CLINICAL DATA: Essential hypertension.  Follow-up.

EXAM:
CHEST  2 VIEW

[chest pa]
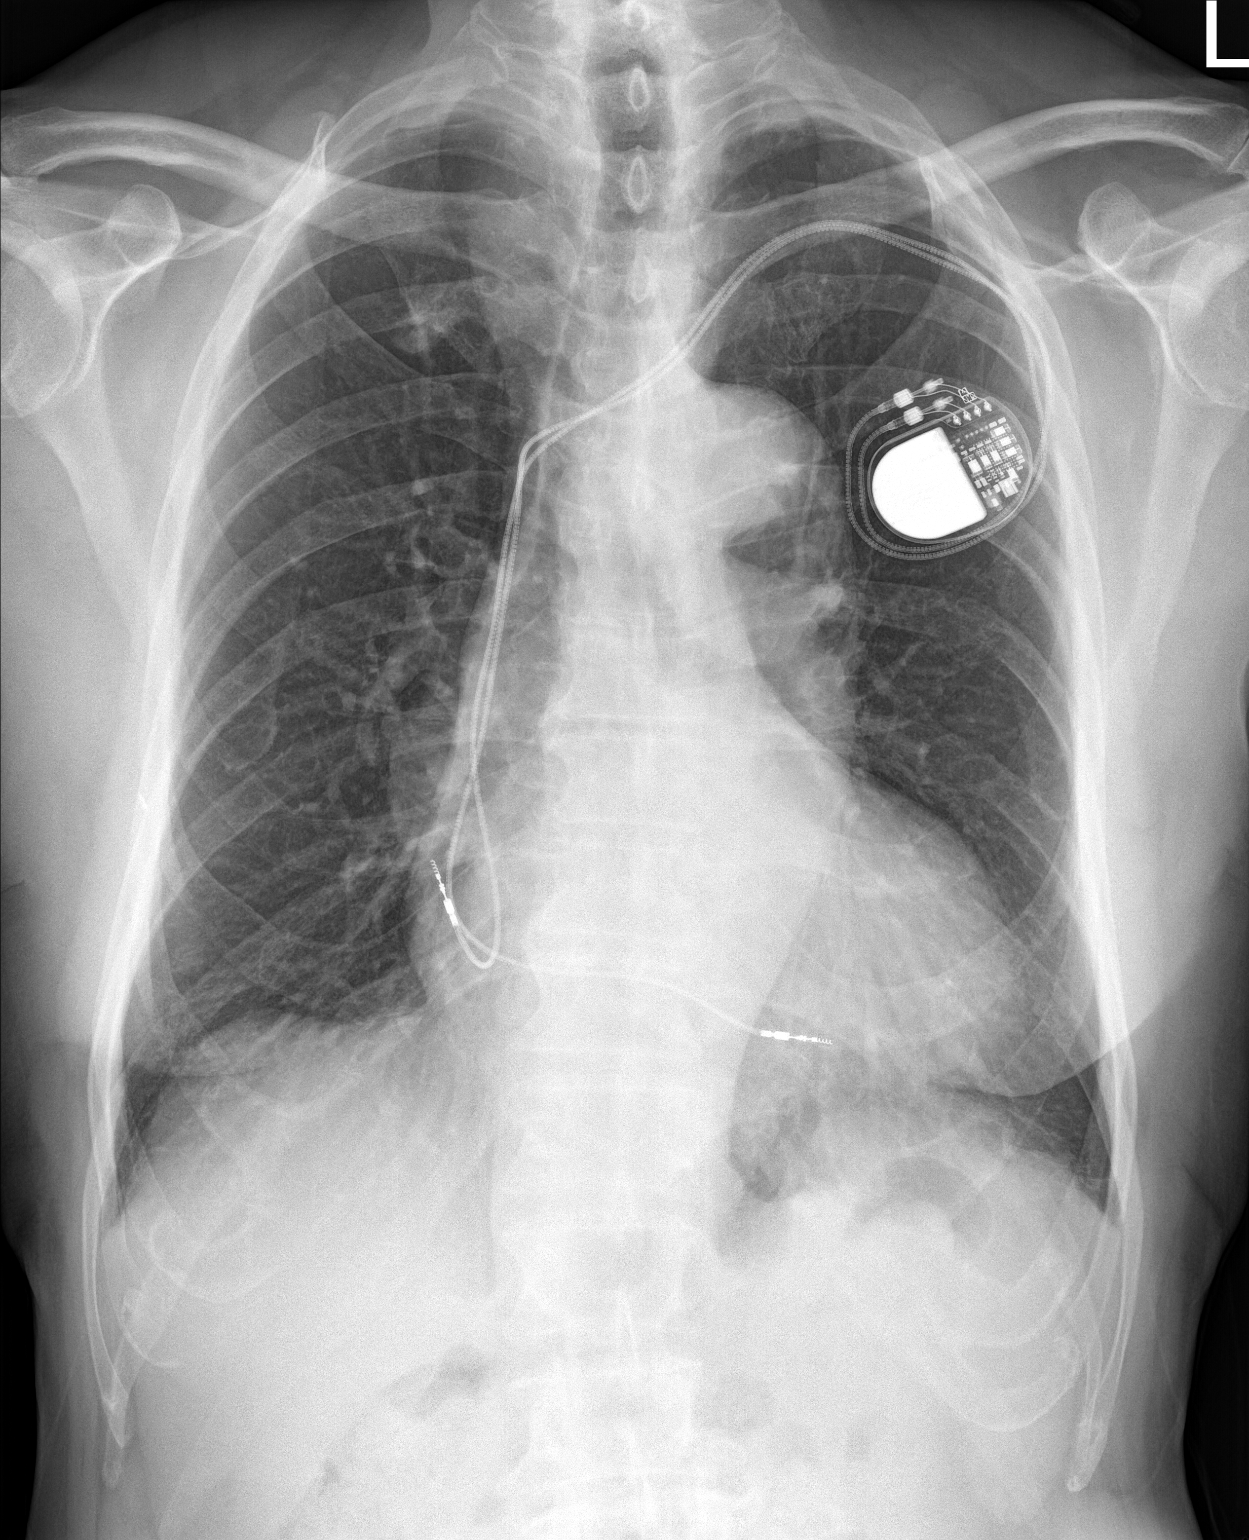

[chest lat]
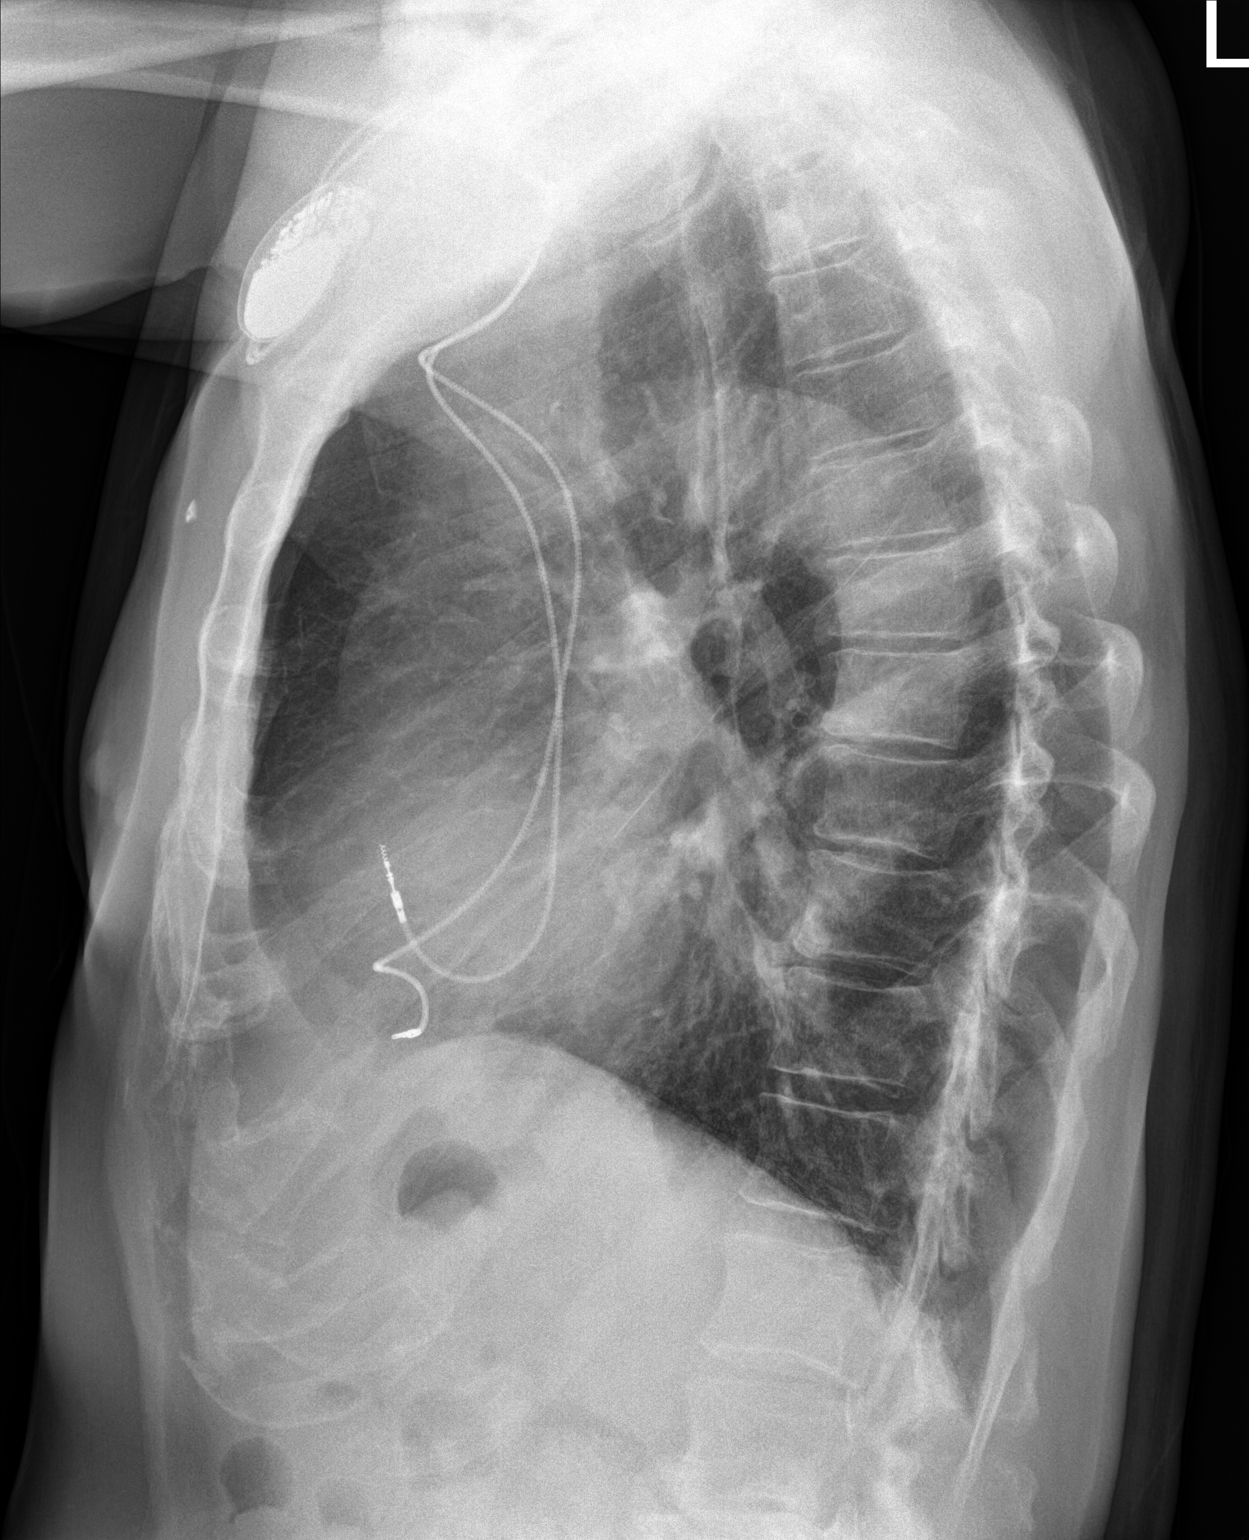

[2 of 2 positions shown; findings below may reference images not displayed]

FINDINGS: Cardiomegaly. Tortuous aorta. Dual lead pacemaker appears the same.
The lungs are clear. The vascularity is normal. No effusions.
Chronic density associated with the end of the first rib.
IMPRESSION: No active disease. Chronic cardiomegaly and aortic atherosclerosis.
Pacemaker.

## 2023-08-16 DEATH — deceased
